# Patient Record
Sex: Female | Born: 1980 | Race: Black or African American | Hispanic: No | Marital: Single | Smoking: Never smoker
Health system: Southern US, Community
[De-identification: ages and names within clinical notes are randomized; demographics above are authoritative.]

## PROBLEM LIST (undated history)

## (undated) DIAGNOSIS — D649 Anemia, unspecified: Secondary | ICD-10-CM

## (undated) DIAGNOSIS — E785 Hyperlipidemia, unspecified: Secondary | ICD-10-CM

## (undated) HISTORY — DX: Hyperlipidemia, unspecified: E78.5

## (undated) HISTORY — PX: WISDOM TOOTH EXTRACTION: SHX21

## (undated) HISTORY — DX: Anemia, unspecified: D64.9

---

## 2007-08-31 ENCOUNTER — Emergency Department (HOSPITAL_COMMUNITY): Admission: EM | Admit: 2007-08-31 | Discharge: 2007-08-31 | Payer: Self-pay | Admitting: Family Medicine

## 2008-05-26 ENCOUNTER — Emergency Department (HOSPITAL_COMMUNITY): Admission: EM | Admit: 2008-05-26 | Discharge: 2008-05-26 | Payer: Self-pay | Admitting: Emergency Medicine

## 2009-08-09 ENCOUNTER — Ambulatory Visit: Payer: Self-pay | Admitting: Family Medicine

## 2009-08-09 DIAGNOSIS — R5381 Other malaise: Secondary | ICD-10-CM | POA: Insufficient documentation

## 2009-08-09 DIAGNOSIS — R5383 Other fatigue: Secondary | ICD-10-CM

## 2009-08-10 LAB — CONVERTED CEMR LAB
BUN: 12 mg/dL (ref 6–23)
Basophils Absolute: 0.1 10*3/uL (ref 0.0–0.1)
Cholesterol: 230 mg/dL — ABNORMAL HIGH (ref 0–200)
Eosinophils Absolute: 0.1 10*3/uL (ref 0.0–0.7)
GFR calc non Af Amer: 109.74 mL/min (ref >60)
HCT: 35 % — ABNORMAL LOW (ref 36.0–46.0)
Hemoglobin: 12.1 g/dL (ref 12.0–15.0)
Lymphocytes Relative: 33.6 % (ref 12.0–46.0)
Lymphs Abs: 2.5 10*3/uL (ref 0.7–4.0)
MCHC: 34.6 g/dL (ref 30.0–36.0)
Neutro Abs: 4.3 10*3/uL (ref 1.4–7.7)
Potassium: 3.7 meq/L (ref 3.5–5.1)
RDW: 12.6 % (ref 11.5–14.6)
Triglycerides: 57 mg/dL (ref 0.0–149.0)
VLDL: 11.4 mg/dL (ref 0.0–40.0)

## 2009-08-18 ENCOUNTER — Encounter: Payer: Self-pay | Admitting: Family Medicine

## 2009-08-18 ENCOUNTER — Ambulatory Visit: Payer: Self-pay | Admitting: Family Medicine

## 2009-08-18 ENCOUNTER — Other Ambulatory Visit: Admission: RE | Admit: 2009-08-18 | Discharge: 2009-08-18 | Payer: Self-pay | Admitting: Family Medicine

## 2009-08-18 DIAGNOSIS — E785 Hyperlipidemia, unspecified: Secondary | ICD-10-CM | POA: Insufficient documentation

## 2009-08-18 DIAGNOSIS — E7800 Pure hypercholesterolemia, unspecified: Secondary | ICD-10-CM | POA: Insufficient documentation

## 2009-08-18 LAB — CONVERTED CEMR LAB
ALT: 14 units/L (ref 0–35)
Albumin: 3.9 g/dL (ref 3.5–5.2)
Total Protein: 7 g/dL (ref 6.0–8.3)

## 2009-08-18 LAB — HM PAP SMEAR

## 2009-08-21 LAB — CONVERTED CEMR LAB

## 2009-10-02 ENCOUNTER — Ambulatory Visit: Payer: Self-pay | Admitting: Family Medicine

## 2009-10-03 LAB — CONVERTED CEMR LAB
ALT: 13 units/L (ref 0–35)
AST: 18 units/L (ref 0–37)

## 2011-01-16 ENCOUNTER — Other Ambulatory Visit: Payer: Self-pay | Admitting: *Deleted

## 2011-01-16 MED ORDER — SIMVASTATIN 10 MG PO TABS: 10.0000 mg | ORAL_TABLET | Freq: Every evening | ORAL | Status: DC

## 2011-03-14 ENCOUNTER — Other Ambulatory Visit: Payer: Self-pay | Admitting: *Deleted

## 2011-03-14 MED ORDER — SIMVASTATIN 10 MG PO TABS: 10.0000 mg | ORAL_TABLET | Freq: Every evening | ORAL | Status: DC

## 2011-07-16 LAB — CULTURE, ROUTINE-ABSCESS

## 2011-09-19 ENCOUNTER — Other Ambulatory Visit: Payer: Self-pay | Admitting: Family Medicine

## 2011-09-19 DIAGNOSIS — E785 Hyperlipidemia, unspecified: Secondary | ICD-10-CM

## 2011-09-19 DIAGNOSIS — Z0000 Encounter for general adult medical examination without abnormal findings: Secondary | ICD-10-CM

## 2011-09-23 ENCOUNTER — Other Ambulatory Visit (INDEPENDENT_AMBULATORY_CARE_PROVIDER_SITE_OTHER): Payer: 59

## 2011-09-23 DIAGNOSIS — E785 Hyperlipidemia, unspecified: Secondary | ICD-10-CM

## 2011-09-23 DIAGNOSIS — Z0000 Encounter for general adult medical examination without abnormal findings: Secondary | ICD-10-CM

## 2011-09-23 LAB — HEPATIC FUNCTION PANEL
ALT: 13 U/L (ref 0–35)
AST: 19 U/L (ref 0–37)
Albumin: 4 g/dL (ref 3.5–5.2)
Alkaline Phosphatase: 52 U/L (ref 39–117)

## 2011-09-23 LAB — BASIC METABOLIC PANEL
CO2: 24 mEq/L (ref 19–32)
Calcium: 9.1 mg/dL (ref 8.4–10.5)
Creatinine, Ser: 0.8 mg/dL (ref 0.4–1.2)
GFR: 108.14 mL/min (ref >60.00)
Glucose, Bld: 98 mg/dL (ref 70–99)

## 2011-09-23 LAB — LIPID PANEL
Cholesterol: 216 mg/dL — ABNORMAL HIGH (ref 0–200)
Total CHOL/HDL Ratio: 4

## 2011-09-24 ENCOUNTER — Other Ambulatory Visit: Payer: Self-pay

## 2011-09-27 ENCOUNTER — Encounter: Payer: Self-pay | Admitting: Family Medicine

## 2011-09-30 ENCOUNTER — Encounter: Payer: Self-pay | Admitting: Family Medicine

## 2011-09-30 ENCOUNTER — Ambulatory Visit (INDEPENDENT_AMBULATORY_CARE_PROVIDER_SITE_OTHER): Payer: 59 | Admitting: Family Medicine

## 2011-09-30 ENCOUNTER — Other Ambulatory Visit (HOSPITAL_COMMUNITY)
Admission: RE | Admit: 2011-09-30 | Discharge: 2011-09-30 | Disposition: A | Discharge status: Home / Self Care | Payer: 59 | Source: Ambulatory Visit | Attending: Family Medicine | Admitting: Family Medicine

## 2011-09-30 VITALS — BP 130/90 | HR 77 | Temp 98.7°F | Ht 62.0 in | Wt 161.5 lb

## 2011-09-30 DIAGNOSIS — Z113 Encounter for screening for infections with a predominantly sexual mode of transmission: Secondary | ICD-10-CM | POA: Insufficient documentation

## 2011-09-30 DIAGNOSIS — Z1159 Encounter for screening for other viral diseases: Secondary | ICD-10-CM | POA: Insufficient documentation

## 2011-09-30 DIAGNOSIS — Z0000 Encounter for general adult medical examination without abnormal findings: Secondary | ICD-10-CM

## 2011-09-30 DIAGNOSIS — Z01419 Encounter for gynecological examination (general) (routine) without abnormal findings: Secondary | ICD-10-CM | POA: Insufficient documentation

## 2011-09-30 MED ORDER — SIMVASTATIN 10 MG PO TABS: 10.0000 mg | ORAL_TABLET | Freq: Every evening | ORAL | Status: DC

## 2011-09-30 NOTE — Progress Notes (Signed)
  Subjective:    Patient ID: Tina Cooper, female    DOB: June 30, 1981, 30 y.o.   MRN: [id number]  HPI  30 yo here for CPX.  Doing well.  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  HLD- ran out of zocor months ago but lipid panel has improved. No myalgias.  Lab Results  Component Value Date   CHOL 216* 09/23/2011   HDL 49.70 09/23/2011   LDLCALC 127* 10/02/2009   LDLDIRECT 163.8 09/23/2011   TRIG 79.0 09/23/2011   CHOLHDL 4 09/23/2011    Review of Systems    See HPI Patient reports no  vision/ hearing changes,anorexia, weight change, fever ,adenopathy, persistant / recurrent hoarseness, swallowing issues, chest pain, edema,persistant / recurrent cough, hemoptysis, dyspnea(rest, exertional, paroxysmal nocturnal), gastrointestinal  bleeding (melena, rectal bleeding), abdominal pain, excessive heart burn, GU symptoms(dysuria, hematuria, pyuria, voiding/incontinence  Issues) syncope, focal weakness, severe memory loss, concerning skin lesions, depression, anxiety, abnormal bruising/bleeding, major joint swelling, breast masses or abnormal vaginal bleeding.    Objective:   Physical Exam BP 130/90  Pulse 77  Temp(Src) 98.7 F (37.1 C) (Oral)  Ht 5\' 2"  (1.575 m)  Wt 161 lb 8 oz (73.256 kg)  BMI 29.54 kg/m2  LMP 09/22/2011  General:  Well-developed,well-nourished,in no acute distress; alert,appropriate and cooperative throughout examination Head:  normocephalic and atraumatic.   Eyes:  vision grossly intact, pupils equal, pupils round, and pupils reactive to light.   Ears:  R ear normal and L ear normal.   Nose:  no external deformity.   Mouth:  good dentition.   Neck:  No deformities, masses, or tenderness noted. Breasts:  No mass, nodules, thickening, tenderness, bulging, retraction, inflamation, nipple discharge or skin changes noted.   Lungs:  Normal respiratory effort, chest expands symmetrically. Lungs are clear to auscultation, no crackles or wheezes. Heart:  Normal  rate and regular rhythm. S1 and S2 normal without gallop, murmur, click, rub or other extra sounds. Abdomen:  Bowel sounds positive,abdomen soft and non-tender without masses, organomegaly or hernias noted. Rectal:  no external abnormalities.   Genitalia:  Pelvic Exam:        External: normal female genitalia without lesions or masses        Vagina: normal without lesions or masses        Cervix: normal without lesions or masses        Adnexa: normal bimanual exam without masses or fullness        Uterus: normal by palpation        Pap smear: performed Msk:  No deformity or scoliosis noted of thoracic or lumbar spine.   Extremities:  No clubbing, cyanosis, edema, or deformity noted with normal full range of motion of all joints.   Neurologic:  alert & oriented X3 and gait normal.   Skin:  Intact without suspicious lesions or rashes Cervical Nodes:  No lymphadenopathy noted Axillary Nodes:  No palpable lymphadenopathy Psych:  Cognition and judgment appear intact. Alert and cooperative with normal attention span and concentration. No apparent delusions, illusions, hallucination      Assessment & Plan:   1. Routine general medical examination at a health care facility  Cytology -Pap Smear   Reviewed preventive care protocols, scheduled due services, and updated immunizations Discussed nutrition, exercise, diet, and healthy lifestyle.

## 2011-09-30 NOTE — Patient Instructions (Signed)
Have a wonderful Christmas, [place]. We will send you a letter or call you regarding your pap smear results. Please restart the Zocor.

## 2011-10-04 ENCOUNTER — Encounter: Payer: Self-pay | Admitting: *Deleted

## 2012-06-26 ENCOUNTER — Telehealth: Payer: Self-pay

## 2012-06-26 NOTE — Telephone Encounter (Signed)
Pt left v/m that she had a question. No further message. Left v/m for pt to call back.

## 2012-07-03 NOTE — Telephone Encounter (Signed)
left vm for pt to call back.

## 2012-07-09 NOTE — Telephone Encounter (Signed)
Letter sent to pt

## 2012-08-31 ENCOUNTER — Ambulatory Visit (INDEPENDENT_AMBULATORY_CARE_PROVIDER_SITE_OTHER): Payer: 59 | Admitting: Family Medicine

## 2012-08-31 ENCOUNTER — Encounter: Payer: Self-pay | Admitting: Family Medicine

## 2012-08-31 VITALS — BP 150/88 | HR 80 | Temp 98.5°F | Wt 160.0 lb

## 2012-08-31 DIAGNOSIS — J329 Chronic sinusitis, unspecified: Secondary | ICD-10-CM

## 2012-08-31 MED ORDER — AMOXICILLIN 875 MG PO TABS: 875.0000 mg | ORAL_TABLET | Freq: Two times a day (BID) | ORAL | Status: DC

## 2012-08-31 MED ORDER — FLUTICASONE PROPIONATE 50 MCG/ACT NA SUSP: 2.0000 | Freq: Every day | NASAL | Status: DC

## 2012-08-31 NOTE — Patient Instructions (Addendum)
Take antibiotic as directed- 1 tablet twice daily x 10 days.  Flonase. Drink lots of fluids.  Treat sympotmatically with Mucinex, nasal saline irrigation, and Tylenol/Ibuprofen.  Call if not improving as expected in 5-7 days.

## 2012-08-31 NOTE — Progress Notes (Signed)
SUBJECTIVE:  Tina Cooper is a 31 y.o. female who complains of coryza, congestion, sore throat, dry cough and bilateral sinus pain for 8 days. She denies a history of anorexia, chest pain, fatigue, fevers, myalgias and nausea and denies a history of asthma. Patient denies smoke cigarettes.   Patient Active Problem List  Diagnosis  . HYPERLIPIDEMIA  . FATIGUE  . Routine general medical examination at a health care facility   No past medical history on file. No past surgical history on file. History  Substance Use Topics  . Smoking status: Never Smoker   . Smokeless tobacco: Not on file  . Alcohol Use: No   Family History  Problem Relation Age of Onset  . Diabetes Mother   . Hypertension Mother   . Hyperlipidemia Father   . Hypertension Father    No Known Allergies Current Outpatient Prescriptions on File Prior to Visit  Medication Sig Dispense Refill  . simvastatin (ZOCOR) 10 MG tablet Take 1 tablet (10 mg total) by mouth every evening.  30 tablet  11   The PMH, PSH, Social History, Family History, Medications, and allergies have been reviewed in [facility], and have been updated if relevant.  OBJECTIVE: BP 150/88  Pulse 80  Temp 98.5 F (36.9 C)  Wt 160 lb (72.576 kg)  She appears well, vital signs are as noted. Ears normal.  Throat and pharynx normal.  Neck supple. No adenopathy in the neck. Nose is congested. Sinuses non tender. The chest is clear, without wheezes or rales.  ASSESSMENT:  sinusitis  PLAN: Given duration and progression of symptoms, will treat for bacterial sinusitis with Augmentin, start Flonase. Symptomatic therapy suggested: push fluids, rest and return office visit prn if symptoms persist or worsen.  Call or return to clinic prn if these symptoms worsen or fail to improve as anticipated.

## 2012-09-24 ENCOUNTER — Other Ambulatory Visit: Payer: Self-pay | Admitting: Family Medicine

## 2012-11-21 ENCOUNTER — Other Ambulatory Visit: Payer: Self-pay

## 2012-12-02 ENCOUNTER — Other Ambulatory Visit: Payer: Self-pay | Admitting: Family Medicine

## 2013-01-14 ENCOUNTER — Encounter: Payer: Self-pay | Admitting: Family Medicine

## 2013-01-14 ENCOUNTER — Ambulatory Visit (INDEPENDENT_AMBULATORY_CARE_PROVIDER_SITE_OTHER): Payer: 59 | Admitting: Family Medicine

## 2013-01-14 VITALS — BP 120/82 | HR 84 | Temp 98.5°F | Wt 159.0 lb

## 2013-01-14 DIAGNOSIS — Z113 Encounter for screening for infections with a predominantly sexual mode of transmission: Secondary | ICD-10-CM

## 2013-01-14 DIAGNOSIS — E785 Hyperlipidemia, unspecified: Secondary | ICD-10-CM

## 2013-01-14 LAB — COMPREHENSIVE METABOLIC PANEL
ALT: 11 U/L (ref 0–35)
AST: 15 U/L (ref 0–37)
CO2: 25 mEq/L (ref 19–32)
Chloride: 107 mEq/L (ref 96–112)
GFR: 112.04 mL/min (ref >60.00)
Sodium: 138 mEq/L (ref 135–145)
Total Bilirubin: 0.2 mg/dL — ABNORMAL LOW (ref 0.3–1.2)
Total Protein: 7.3 g/dL (ref 6.0–8.3)

## 2013-01-14 LAB — LIPID PANEL: VLDL: 17.8 mg/dL (ref 0.0–40.0)

## 2013-01-14 MED ORDER — SIMVASTATIN 10 MG PO TABS: ORAL_TABLET | ORAL | Status: DC

## 2013-01-14 NOTE — Patient Instructions (Signed)
Good to see you. We will call you with your lab results.   

## 2013-01-14 NOTE — Progress Notes (Signed)
32 yo here for Rx refills.  Has not been seen for routine care since 2012.  Doing well. 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  HLD- on zocor 10 mg daily.  Due for labs. No myalgias.   Lab Results  Component Value Date   CHOL 216* 09/23/2011   HDL 49.70 09/23/2011   LDLCALC 127* 10/02/2009   LDLDIRECT 163.8 09/23/2011   TRIG 79.0 09/23/2011   CHOLHDL 4 09/23/2011   She would also like STD screening.  Denies any vaginal discharge or dysuria.  Patient Active Problem List  Diagnosis  . HYPERLIPIDEMIA  . FATIGUE   No past medical history on file. No past surgical history on file. History  Substance Use Topics  . Smoking status: Never Smoker   . Smokeless tobacco: Not on file  . Alcohol Use: No   Family History  Problem Relation Age of Onset  . Diabetes Mother   . Hypertension Mother   . Hyperlipidemia Father   . Hypertension Father    No Known Allergies Current Outpatient Prescriptions on File Prior to Visit  Medication Sig Dispense Refill  . fluticasone (FLONASE) 50 MCG/ACT nasal spray Place 2 sprays into the nose daily.  16 g  6   No current facility-administered medications on file prior to visit.   The PMH, PSH, Social History, Family History, Medications, and allergies have been reviewed in [facility], and have been updated if relevant.  ROS: See HPI  Physical exam: BP 120/82  Pulse 84  Temp(Src) 98.5 F (36.9 C)  Wt 159 lb (72.122 kg)  BMI 29.07 kg/m2  General:  Well-developed,well-nourished,in no acute distress; alert,appropriate and cooperative throughout examination Head:  normocephalic and atraumatic.   Eyes:  vision grossly intact, pupils equal, pupils round, and pupils reactive to light.   Ears:  R ear normal and L ear normal.   Nose:  no external deformity.   Mouth:  good dentition.   Lungs:  Normal respiratory effort, chest expands symmetrically. Lungs are clear to auscultation, no crackles or wheezes. Heart:  Normal rate and regular  rhythm. S1 and S2 normal without gallop, murmur, click, rub or other extra sounds. Msk:  No deformity or scoliosis noted of thoracic or lumbar spine.   Extremities:  No clubbing, cyanosis, edema, or deformity noted with normal full range of motion of all joints.   Neurologic:  alert & oriented X3 and gait normal.   Skin:  Intact without suspicious lesions or rashes Psych:  Cognition and judgment appear intact. Alert and cooperative with normal attention span and concentration. No apparent delusions, illusions, hallucinations  Assessment and Plan: 1. HYPERLIPIDEMIA Continue Zocor 10 mg daily.  Rx refilled.  Check labs today. - Comprehensive metabolic panel - Lipid Panel  2. Screening for STDs (sexually transmitted diseases) Declined pelvic exam and vaginal swabs. - HIV Antibody - RPR - HSV(herpes simplex vrs) 1+2 ab-IgM

## 2013-01-15 LAB — RPR

## 2013-01-15 LAB — HIV ANTIBODY (ROUTINE TESTING W REFLEX): HIV: NONREACTIVE

## 2013-01-15 LAB — HSV(HERPES SIMPLEX VRS) I + II AB-IGM: Herpes Simplex Vrs I&II-IgM Ab (EIA): 0.91 INDEX

## 2013-01-15 MED ORDER — SIMVASTATIN 20 MG PO TABS: 20.0000 mg | ORAL_TABLET | Freq: Every evening | ORAL | Status: DC

## 2013-01-15 NOTE — Addendum Note (Signed)
Addended by: Eliezer Bottom on: 01/15/2013 10:17 AM   Modules accepted: Orders

## 2013-06-15 ENCOUNTER — Other Ambulatory Visit: Payer: Self-pay

## 2013-06-15 ENCOUNTER — Other Ambulatory Visit (INDEPENDENT_AMBULATORY_CARE_PROVIDER_SITE_OTHER): Payer: 59

## 2013-06-15 ENCOUNTER — Other Ambulatory Visit: Payer: Self-pay | Admitting: Family Medicine

## 2013-06-15 DIAGNOSIS — R5381 Other malaise: Secondary | ICD-10-CM

## 2013-06-15 DIAGNOSIS — E785 Hyperlipidemia, unspecified: Secondary | ICD-10-CM

## 2013-06-15 LAB — COMPREHENSIVE METABOLIC PANEL
ALT: 16 U/L (ref 0–35)
BUN: 18 mg/dL (ref 6–23)
CO2: 26 mEq/L (ref 19–32)
Calcium: 9.5 mg/dL (ref 8.4–10.5)
Chloride: 105 mEq/L (ref 96–112)
Creatinine, Ser: 0.8 mg/dL (ref 0.4–1.2)
GFR: 101.07 mL/min (ref >60.00)

## 2013-06-15 LAB — LIPID PANEL
Cholesterol: 183 mg/dL (ref 0–200)
Triglycerides: 151 mg/dL — ABNORMAL HIGH (ref 0.0–149.0)

## 2013-06-15 MED ORDER — SIMVASTATIN 20 MG PO TABS: 20.0000 mg | ORAL_TABLET | Freq: Every evening | ORAL | Status: DC

## 2013-06-15 NOTE — Progress Notes (Signed)
Terri in lab said not to order labs she would take care of ordering.

## 2013-06-15 NOTE — Telephone Encounter (Signed)
Pt request refill Simvastatin to [facility] out pt pharmacy. Advised done. Pt to have fasting lab lipid and CMET this AM.

## 2013-06-16 ENCOUNTER — Encounter: Payer: Self-pay | Admitting: *Deleted

## 2013-07-22 ENCOUNTER — Ambulatory Visit (INDEPENDENT_AMBULATORY_CARE_PROVIDER_SITE_OTHER): Payer: 59 | Admitting: Family Medicine

## 2013-07-22 ENCOUNTER — Encounter: Payer: Self-pay | Admitting: Family Medicine

## 2013-07-22 VITALS — BP 122/70 | HR 80 | Temp 98.9°F | Wt 161.0 lb

## 2013-07-22 DIAGNOSIS — R21 Rash and other nonspecific skin eruption: Secondary | ICD-10-CM

## 2013-07-22 DIAGNOSIS — N898 Other specified noninflammatory disorders of vagina: Secondary | ICD-10-CM

## 2013-07-22 DIAGNOSIS — Z113 Encounter for screening for infections with a predominantly sexual mode of transmission: Secondary | ICD-10-CM

## 2013-07-22 NOTE — Progress Notes (Signed)
  Subjective:    Patient ID: Tina Cooper, female    DOB: 12-12-1980, 32 y.o.   MRN: [id number]  HPI CC: vag discharge  1 d h/o vaginal discharge.  Milky white.   No fevers/chills, abd pain, nausea, itching, swollen glands. + back pain.  Small sores in pubic region she would like evaluated as well.  Not tender.  No drainage  LMP - 07/04/2013  Currently sexually active - 2 partners in last year.  Would like testing for STDs. No h/o STDs.  Always normal pap smears.  Last 2012.  History reviewed. No pertinent past medical history.   Review of Systems Per HPI    Objective:   Physical Exam  Nursing note and vitals reviewed. Constitutional: She appears well-developed and well-nourished. No distress.  Abdominal: Soft. Normal appearance and bowel sounds are normal. She exhibits no distension and no mass. There is no hepatosplenomegaly. There is no tenderness. There is no rigidity, no rebound, no guarding, no CVA tenderness and negative Murphy's sign.  Genitourinary: Vagina normal. Pelvic exam was performed with patient supine. There is no rash, tenderness, lesion or injury on the right labia. There is no rash, tenderness, lesion or injury on the left labia. Cervix exhibits no motion tenderness, no discharge and no friability. No tenderness around the vagina. No signs of injury around the vagina. No vaginal discharge found.  Mild white physiological discharge  Lymphadenopathy:       Right: No inguinal adenopathy present.       Left: No inguinal adenopathy present.  Skin: Rash noted.  Few bumps on inguinal region with mild induration, no erythema, nontender, no fluctuance       Assessment & Plan:

## 2013-07-22 NOTE — Patient Instructions (Addendum)
Wet prep checked today.  STD check today. We will call you with results. I think you have small case of folliculitis - treat with warm compresses to speed resolution.  If persistent let us know. Call us with questions.

## 2013-07-22 NOTE — Assessment & Plan Note (Addendum)
Overall normal exam. Check wet prep today. STD check per pt preference today as well. Will call with results.

## 2013-07-22 NOTE — Addendum Note (Signed)
Addended by: Josph Macho A on: 07/22/2013 07:25 PM   Modules accepted: Orders

## 2013-07-22 NOTE — Assessment & Plan Note (Signed)
Anticipate mild case of folliculitis, but in healing stage.  rec warm compresses.  Update if persistent, consider short oral abx course.

## 2013-07-23 LAB — GC/CHLAMYDIA PROBE AMP: CT Probe RNA: NEGATIVE

## 2013-07-23 LAB — RPR

## 2013-07-24 ENCOUNTER — Other Ambulatory Visit: Payer: Self-pay | Admitting: Family Medicine

## 2013-07-24 LAB — WET PREP BY MOLECULAR PROBE
Candida species: NEGATIVE
Gardnerella vaginalis: POSITIVE — AB
Trichomonas vaginosis: NEGATIVE

## 2013-07-24 MED ORDER — METRONIDAZOLE 500 MG PO TABS: 500.0000 mg | ORAL_TABLET | Freq: Two times a day (BID) | ORAL | Status: DC

## 2013-08-02 ENCOUNTER — Encounter: Payer: Self-pay | Admitting: Family Medicine

## 2013-08-02 ENCOUNTER — Ambulatory Visit (INDEPENDENT_AMBULATORY_CARE_PROVIDER_SITE_OTHER): Payer: 59 | Admitting: Family Medicine

## 2013-08-02 VITALS — BP 150/90 | HR 80 | Temp 99.1°F | Wt 164.5 lb

## 2013-08-02 DIAGNOSIS — J019 Acute sinusitis, unspecified: Secondary | ICD-10-CM

## 2013-08-02 MED ORDER — FLUTICASONE PROPIONATE 50 MCG/ACT NA SUSP: 2.0000 | Freq: Every day | NASAL | Status: DC

## 2013-08-02 MED ORDER — AMOXICILLIN-POT CLAVULANATE 875-125 MG PO TABS: 1.0000 | ORAL_TABLET | Freq: Two times a day (BID) | ORAL | Status: AC

## 2013-08-02 NOTE — Assessment & Plan Note (Signed)
Anticipate viral given short duration. Treat with WASP for augmentin.  Reasons to treat discussed. Pt agrees with plan. See pt instructions for plan.

## 2013-08-02 NOTE — Progress Notes (Signed)
Patient seen and examined with PA student Ricarda Frame.  Note reviewed, agree with assessment and plan unless changes documented in my note.   CC: sinus infection?   5d h/o facial pressure over maxillary sinuses, cough productive of thin clear mucous, yellow rhinorrhea, and facial stuffiness.  Tried tylenol, flonase, mucinex, with only mid relief.  No fevers/chills, chest tightness or dyspnea, HA, abd pain, tooth pain, sneezing, wheezing, ear pain or itchy eyes.  No PNdrainage  Mild allergic rhinitis. No smokers at home. Works at hospital.  PE: WDWN, AAF, NAD, tired appearing Tender over maxillary sinuses, none over frontal.  Nasal mucosal edema and pallor. Oropharynx clear No cervical LAD CTAB, no crackles/wheezing Nl S1, S2, no m/r/g

## 2013-08-02 NOTE — Patient Instructions (Signed)
You have a sinus infection. I do think this is viral so give a few more days - most sinus infections resolve within 7-10 days.  If persistent symptoms or worsening symptoms, fill antibiotic (augmentin). Push fluids and plenty of rest. Nasal saline irrigation or neti pot to help drain sinuses. May use simple mucinex with plenty of fluid to help mobilize mucous. Let us know if fever >101.5, trouble opening/closing mouth, difficulty swallowing, or worsening - you may need to be seen again.

## 2013-08-02 NOTE — Progress Notes (Signed)
  Subjective:    Patient ID: Tina Cooper, female    DOB: 1980-10-16, 32 y.o.   MRN: [id number]  HPI CC: facial pressure, productive cough, congestion, rhinorrhea, & sore throat.  Facial pressure over maxillary sinuses.Cough productive of clear, thin mucus. Yellow rhinorrhea.  Symptoms began 5 days ago with little improvement. Tylenol cold and sinus, ibuprofen, Mucinex, and Flonase have provided mild relief but symptoms persist. Nothing makes symptoms worse. Similar symptoms last October treated with antibiotic. Hx of mild seasonal allergies but feels this is worse.  Denies SOB, post nasal drip, headaches, fevers, chills, nausea, vomiting, diarrhea, constipation, palpitations, chest pain, tooth pain, sneezing, wheezing, ear pain, difficulty swallowing, itchy eyes. .   Review of Systems Per HPI    Objective:   Physical Exam  Constitutional: She appears well-developed and well-nourished. No distress.  HENT:  Head: Normocephalic and atraumatic.  Right Ear: Tympanic membrane and external ear normal.  Left Ear: Tympanic membrane and external ear normal.  Nose: Mucosal edema present. Right sinus exhibits maxillary sinus tenderness. Right sinus exhibits no frontal sinus tenderness. Left sinus exhibits maxillary sinus tenderness. Left sinus exhibits no frontal sinus tenderness.  Mouth/Throat: Oropharynx is clear and moist. No oropharyngeal exudate or posterior oropharyngeal erythema.  Pale mucosa   Eyes: Conjunctivae and EOM are normal. Pupils are equal, round, and reactive to light. Right eye exhibits no discharge. Left eye exhibits no discharge. No scleral icterus.  Neck: Normal range of motion. Neck supple.  Cardiovascular: Normal rate, regular rhythm, normal heart sounds and intact distal pulses.   Pulmonary/Chest: Effort normal and breath sounds normal. No respiratory distress. She has no wheezes. She has no rales.  Abdominal: Soft. Bowel sounds are normal.  Lymphadenopathy:    She has no  cervical adenopathy.  Skin: Skin is warm and dry. No rash noted. She is not diaphoretic. No erythema.       Assessment & Plan:  Sinusitus  Explained that most sinus infections are viral and should resolve within 7-10 days Continue Mucinex with plenty of water, Ibuprofen, and Flonase as needed.  Handed prescription for Augmentin 875/125 mg BID for 10 days and instructed to fill only if symptoms persist beyond 7-10 days. Contact us if symptoms worsen or fail to improve.

## 2013-08-12 ENCOUNTER — Other Ambulatory Visit: Payer: Self-pay

## 2013-08-24 ENCOUNTER — Encounter: Payer: 59 | Admitting: Internal Medicine

## 2013-10-06 ENCOUNTER — Other Ambulatory Visit: Payer: Self-pay | Admitting: Family Medicine

## 2014-01-24 ENCOUNTER — Other Ambulatory Visit: Payer: Self-pay | Admitting: Family Medicine

## 2014-02-24 ENCOUNTER — Other Ambulatory Visit: Payer: Self-pay | Admitting: Family Medicine

## 2014-04-05 ENCOUNTER — Other Ambulatory Visit: Payer: Self-pay | Admitting: Family Medicine

## 2014-04-29 ENCOUNTER — Ambulatory Visit (INDEPENDENT_AMBULATORY_CARE_PROVIDER_SITE_OTHER): Payer: 59 | Admitting: Family Medicine

## 2014-04-29 ENCOUNTER — Encounter: Payer: Self-pay | Admitting: Family Medicine

## 2014-04-29 VITALS — BP 142/92 | HR 81 | Temp 98.2°F | Ht 62.0 in | Wt 159.0 lb

## 2014-04-29 DIAGNOSIS — E785 Hyperlipidemia, unspecified: Secondary | ICD-10-CM

## 2014-04-29 DIAGNOSIS — R0602 Shortness of breath: Secondary | ICD-10-CM

## 2014-04-29 LAB — BRAIN NATRIURETIC PEPTIDE: Pro B Natriuretic peptide (BNP): 30 pg/mL (ref 0.0–100.0)

## 2014-04-29 LAB — COMPREHENSIVE METABOLIC PANEL
ALK PHOS: 50 U/L (ref 39–117)
ALT: 11 U/L (ref 0–35)
AST: 22 U/L (ref 0–37)
Albumin: 4 g/dL (ref 3.5–5.2)
BUN: 14 mg/dL (ref 6–23)
CALCIUM: 9.1 mg/dL (ref 8.4–10.5)
CO2: 28 mEq/L (ref 19–32)
CREATININE: 0.7 mg/dL (ref 0.4–1.2)
Chloride: 107 mEq/L (ref 96–112)
GFR: 116.35 mL/min (ref >60.00)
GLUCOSE: 92 mg/dL (ref 70–99)
Potassium: 3.8 mEq/L (ref 3.5–5.1)
Sodium: 138 mEq/L (ref 135–145)
Total Bilirubin: 0.4 mg/dL (ref 0.2–1.2)
Total Protein: 7.4 g/dL (ref 6.0–8.3)

## 2014-04-29 LAB — LIPID PANEL
CHOL/HDL RATIO: 5
Cholesterol: 240 mg/dL — ABNORMAL HIGH (ref 0–200)
HDL: 45.1 mg/dL (ref >39.00)
LDL Cholesterol: 177 mg/dL — ABNORMAL HIGH (ref 0–99)
NonHDL: 194.9
Triglycerides: 89 mg/dL (ref 0.0–149.0)
VLDL: 17.8 mg/dL (ref 0.0–40.0)

## 2014-04-29 MED ORDER — SIMVASTATIN 20 MG PO TABS: ORAL_TABLET | ORAL | Status: DC

## 2014-04-29 NOTE — Assessment & Plan Note (Signed)
Likely deconditioning. Exam reassuring. Advised stress test/EKG but she prefers blood work first. Unlikely CHF or PE but will check labs as requested.

## 2014-04-29 NOTE — Progress Notes (Signed)
Pre visit review using our clinic review tool, if applicable. No additional management support is needed unless otherwise documented below in the visit note. 

## 2014-04-29 NOTE — Progress Notes (Signed)
33 yo here for Rx refills.  Has not been seen by me since 01/2013.  Doing well. 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  HLD- on zocor 10 mg daily.  Due for labs. No myalgias.   Lab Results  Component Value Date   CHOL 183 06/15/2013   HDL 46.20 06/15/2013   LDLCALC 107* 06/15/2013   LDLDIRECT 183.8 01/14/2013   TRIG 151.0* 06/15/2013   CHOLHDL 4 06/15/2013   Wants me to check D dimer and BNP today.  For months, she has noticed more swelling in her legs.  None now because she just woke up. No CP Some DOE at times.  Patient Active Problem List   Diagnosis Date Noted  . Sinusitis, acute 08/02/2013  . Vaginal discharge 07/22/2013  . Skin rash 07/22/2013  . HYPERLIPIDEMIA 08/18/2009  . FATIGUE 08/09/2009   No past medical history on file. No past surgical history on file. History  Substance Use Topics  . Smoking status: Never Smoker   . Smokeless tobacco: Not on file  . Alcohol Use: No   Family History  Problem Relation Age of Onset  . Diabetes Mother   . Hypertension Mother   . Hyperlipidemia Father   . Hypertension Father    No Known Allergies Current Outpatient Prescriptions on File Prior to Visit  Medication Sig Dispense Refill  . fluticasone (FLONASE) 50 MCG/ACT nasal spray Place 2 sprays into the nose daily.  16 g  6   No current facility-administered medications on file prior to visit.   The PMH, PSH, Social History, Family History, Medications, and allergies have been reviewed in [facility], and have been updated if relevant.  ROS: See HPI  Physical exam: BP 142/92  Pulse 81  Temp(Src) 98.2 F (36.8 C) (Oral)  Ht 5\' 2"  (1.575 m)  Wt 159 lb (72.122 kg)  BMI 29.07 kg/m2  SpO2 98%  LMP 04/22/2014  General:  Well-developed,well-nourished,in no acute distress; alert,appropriate and cooperative throughout examination Head:  normocephalic and atraumatic.   Eyes:  vision grossly intact, pupils equal, pupils round, and pupils reactive to light.   Ears:  R  ear normal and L ear normal.   Nose:  no external deformity.   Mouth:  good dentition.   Lungs:  Normal respiratory effort, chest expands symmetrically. Lungs are clear to auscultation, no crackles or wheezes. Heart:  Normal rate and regular rhythm. S1 and S2 normal without gallop, murmur, click, rub or other extra sounds. Msk:  No deformity or scoliosis noted of thoracic or lumbar spine.   Extremities:  No clubbing, cyanosis, edema, or deformity noted with normal full range of motion of all joints.   Neurologic:  alert & oriented X3 and gait normal.   Skin:  Intact without suspicious lesions or rashes Psych:  Cognition and judgment appear intact. Alert and cooperative with normal attention span and concentration. No apparent delusions, illusions, hallucinations

## 2014-04-29 NOTE — Assessment & Plan Note (Signed)
eRx refilled. Check labs today. Orders Placed This Encounter  Procedures  . Lipid panel  . Comprehensive metabolic panel  . Brain natriuretic peptide  . D-dimer, quantitative

## 2014-04-29 NOTE — Patient Instructions (Signed)
Good to see you. I will call you with your lab results.   

## 2014-04-30 LAB — D-DIMER, QUANTITATIVE (NOT AT ARMC): D-Dimer, Quant: 1.6 ug/mL-FEU — ABNORMAL HIGH (ref 0.00–0.48)

## 2014-05-02 ENCOUNTER — Telehealth: Payer: Self-pay

## 2014-05-02 ENCOUNTER — Ambulatory Visit: Payer: Self-pay | Admitting: Family Medicine

## 2014-05-02 ENCOUNTER — Other Ambulatory Visit: Payer: Self-pay | Admitting: Family Medicine

## 2014-05-02 ENCOUNTER — Encounter: Payer: Self-pay | Admitting: Family Medicine

## 2014-05-02 DIAGNOSIS — R7989 Other specified abnormal findings of blood chemistry: Secondary | ICD-10-CM

## 2014-05-02 NOTE — Telephone Encounter (Signed)
It was negative for PE (blood clot in the lung).

## 2014-05-02 NOTE — Telephone Encounter (Signed)
Pt left v/m requesting recent CT results.Please advise.

## 2014-05-02 NOTE — Telephone Encounter (Signed)
We could certainly get a doppler of her legs.  Is either leg more painful or swollen or red?

## 2014-05-02 NOTE — Telephone Encounter (Signed)
Lm on pts vm requesting a call back 

## 2014-05-02 NOTE — Telephone Encounter (Signed)
Patient notified as instructed by telephone. Pt wants to know if there will be further testing done such as test for DVT. Pt request cb.

## 2014-05-03 NOTE — Telephone Encounter (Signed)
Order entered

## 2014-05-03 NOTE — Telephone Encounter (Signed)
Spoke to pt who states that she is having swelling, but no pain or redness. Pt states that she is wanting to proceed with doppler

## 2014-05-11 ENCOUNTER — Other Ambulatory Visit (HOSPITAL_COMMUNITY): Payer: Self-pay | Admitting: *Deleted

## 2014-05-11 DIAGNOSIS — R609 Edema, unspecified: Secondary | ICD-10-CM

## 2014-05-13 ENCOUNTER — Encounter (HOSPITAL_COMMUNITY): Payer: 59

## 2014-05-19 ENCOUNTER — Ambulatory Visit (HOSPITAL_COMMUNITY): Payer: 59 | Attending: Internal Medicine | Admitting: Cardiology

## 2014-05-19 DIAGNOSIS — R0602 Shortness of breath: Secondary | ICD-10-CM | POA: Insufficient documentation

## 2014-05-19 DIAGNOSIS — E785 Hyperlipidemia, unspecified: Secondary | ICD-10-CM | POA: Diagnosis not present

## 2014-05-19 DIAGNOSIS — R609 Edema, unspecified: Secondary | ICD-10-CM | POA: Insufficient documentation

## 2014-05-19 NOTE — Telephone Encounter (Signed)
Spoke to pt and informed her of results.  

## 2014-05-19 NOTE — Progress Notes (Signed)
Bilateral lower venous duplex performed  

## 2014-05-19 NOTE — Telephone Encounter (Signed)
Message copied by Desmond DikeKNIGHT, Maliyah Willets H on Thu May 19, 2014  2:07 PM ------      Message from: Dianne DunARON, TALIA M      Created: Thu May 19, 2014 12:45 PM      Regarding: FW: prelim       Please let pt know that her dopplers were neg for blood clot      ----- Message -----         From: Richardean CanalGina M Germino         Sent: 05/19/2014  12:41 PM           To: Dianne Dunalia M Aron, MD      Subject: prelim                                                   Bilateral lower venous duplex appears negative for acute thrombus.       Thank you        ------

## 2014-07-07 ENCOUNTER — Encounter: Payer: Self-pay | Admitting: Internal Medicine

## 2014-07-07 ENCOUNTER — Other Ambulatory Visit (HOSPITAL_COMMUNITY)
Admission: RE | Admit: 2014-07-07 | Discharge: 2014-07-07 | Disposition: A | Discharge status: Home / Self Care | Payer: 59 | Source: Ambulatory Visit | Attending: Internal Medicine | Admitting: Internal Medicine

## 2014-07-07 ENCOUNTER — Ambulatory Visit (INDEPENDENT_AMBULATORY_CARE_PROVIDER_SITE_OTHER): Payer: 59 | Admitting: Internal Medicine

## 2014-07-07 VITALS — BP 128/74 | HR 93 | Temp 98.4°F | Wt 160.0 lb

## 2014-07-07 DIAGNOSIS — Z01419 Encounter for gynecological examination (general) (routine) without abnormal findings: Secondary | ICD-10-CM | POA: Insufficient documentation

## 2014-07-07 DIAGNOSIS — B373 Candidiasis of vulva and vagina: Secondary | ICD-10-CM

## 2014-07-07 DIAGNOSIS — N9089 Other specified noninflammatory disorders of vulva and perineum: Secondary | ICD-10-CM

## 2014-07-07 DIAGNOSIS — B3731 Acute candidiasis of vulva and vagina: Secondary | ICD-10-CM

## 2014-07-07 DIAGNOSIS — Z124 Encounter for screening for malignant neoplasm of cervix: Secondary | ICD-10-CM

## 2014-07-07 MED ORDER — FLUCONAZOLE 150 MG PO TABS: 150.0000 mg | ORAL_TABLET | Freq: Once | ORAL | Status: DC

## 2014-07-07 NOTE — Progress Notes (Signed)
Pre visit review using our clinic review tool, if applicable. No additional management support is needed unless otherwise documented below in the visit note. 

## 2014-07-07 NOTE — Addendum Note (Signed)
Addended by: Roena MaladyEVONTENNO, Layce Sprung Y on: 07/07/2014 01:50 PM   Modules accepted: Orders

## 2014-07-07 NOTE — Patient Instructions (Signed)

## 2014-07-07 NOTE — Progress Notes (Signed)
Subjective:    Patient ID: Tina Cooper, female    DOB: September 24, 1981, 33 y.o.   MRN: [id number]  HPI  Pt presents to the clinic today with c/o vaginal irritation. She has noticed some redness, swelling and blisters. This started 4 days ago. She reports that she is in a lot of pain. She has noticed some itching. She has tried antifungal cream which seemed to make it worse. She also tried some OTC pain relief cream that did give her some relief. She is sexually active. She does not use anything for protection or birth control. Her LMP was 06/20/14.  Review of Systems      No past medical history on file.  Current Outpatient Prescriptions  Medication Sig Dispense Refill  . fluticasone (FLONASE) 50 MCG/ACT nasal spray Place 2 sprays into the nose daily.  16 g  6  . simvastatin (ZOCOR) 20 MG tablet TAKE 1 TABLET BY MOUTH EVERY EVENING  90 tablet  0   No current facility-administered medications for this visit.    No Known Allergies  Family History  Problem Relation Age of Onset  . Diabetes Mother   . Hypertension Mother   . Hyperlipidemia Father   . Hypertension Father     History   Social History  . Marital Status: Single    Spouse Name: N/A    Number of Children: N/A  . Years of Education: N/A   Occupational History  . RN [employer]   Social History Main Topics  . Smoking status: Never Smoker   . Smokeless tobacco: Not on file  . Alcohol Use: No  . Drug Use: No  . Sexual Activity: Not on file   Other Topics Concern  . Not on file   Social History Narrative  . No narrative on file     Constitutional: Denies fever, malaise, fatigue, headache or abrupt weight changes.  GU: Pt reports vaginal discharge, blisters and irritation. Denies urgency, frequency, pain with urination, burning sensation, blood in urine, odor .   No other specific complaints in a complete review of systems (except as listed in HPI above).  Objective:   Physical Exam  BP 128/74  Pulse  93  Temp(Src) 98.4 F (36.9 C) (Oral)  Wt 160 lb (72.576 kg)  SpO2 98%  LMP 06/19/2014 Wt Readings from Last 3 Encounters:  07/07/14 160 lb (72.576 kg)  04/29/14 159 lb (72.122 kg)  08/02/13 164 lb 8 oz (74.617 kg)    General: Appears her stated age, well developed, well nourished in NAD. Cardiovascular: Normal rate and rhythm. S1,S2 noted.  No murmur, rubs or gallops noted.  Pulmonary/Chest: Normal effort and positive vesicular breath sounds. No respiratory distress. No wheezes, rales or ronchi noted.  Abdomen: Soft and nontender. Normal bowel sounds, no bruits noted. No distention or masses noted. Liver, spleen and kidneys non palpable. GU: Normal female anatomy. Small fluid filled blisters noted on bilateral labial edge, R>L, painful to touch. Thick, chunky white discharge noted at urethral opening. Was not able to visulize cervix. No CMT. [place] non palpable.  BMET    Component Value Date/Time   NA 138 04/29/2014 1157   K 3.8 04/29/2014 1157   CL 107 04/29/2014 1157   CO2 28 04/29/2014 1157   GLUCOSE 92 04/29/2014 1157   BUN 14 04/29/2014 1157   CREATININE 0.7 04/29/2014 1157   CALCIUM 9.1 04/29/2014 1157   GFRNONAA 109.74 08/09/2009 1109    Lipid Panel     Component  Value Date/Time   CHOL 240* 04/29/2014 1157   TRIG 89.0 04/29/2014 1157   HDL 45.10 04/29/2014 1157   CHOLHDL 5 04/29/2014 1157   VLDL 17.8 04/29/2014 1157   LDLCALC 177* 04/29/2014 1157    CBC    Component Value Date/Time   WBC 7.5 08/09/2009 1109   RBC 4.13 08/09/2009 1109   HGB 12.1 08/09/2009 1109   HCT 35.0* 08/09/2009 1109   PLT 279.0 08/09/2009 1109   MCV 84.8 08/09/2009 1109   MCHC 34.6 08/09/2009 1109   RDW 12.6 08/09/2009 1109   LYMPHSABS 2.5 08/09/2009 1109   MONOABS 0.5 08/09/2009 1109   EOSABS 0.1 08/09/2009 1109   BASOSABS 0.1 08/09/2009 1109    Hgb A1C No results found for this basename: HGBA1C         Assessment & Plan:   Vaginal lesions:  Viral culture obtained  Will check serum HSV 1 &  2 Continue pain relief cream at this time  Screening for cervical cancer:  Pap obtained- will mail you a letter with the results It would be a good idea to use protection/birth control if having sexual relation with multiple partners  Vaginal Candidias:  Wet prep: + yeast eRx for Diflucan for antibiotic induced yeast infection  RTC as needed or if symptoms persist or worsen

## 2014-07-08 LAB — CYTOLOGY - PAP

## 2014-07-11 LAB — HSV(HERPES SMPLX)ABS-I+II(IGG+IGM)-BLD
HERPES SIMPLEX VRS I-IGM AB (EIA): 0.83 {index}
HSV 2 Glycoprotein G Ab, IgG: <0.1 IV

## 2014-08-03 ENCOUNTER — Other Ambulatory Visit: Payer: Self-pay | Admitting: Family Medicine

## 2014-11-06 ENCOUNTER — Encounter: Payer: Self-pay | Admitting: Family Medicine

## 2014-11-07 ENCOUNTER — Other Ambulatory Visit: Payer: Self-pay | Admitting: Family Medicine

## 2014-11-07 DIAGNOSIS — B373 Candidiasis of vulva and vagina: Secondary | ICD-10-CM

## 2014-11-07 DIAGNOSIS — B3731 Acute candidiasis of vulva and vagina: Secondary | ICD-10-CM

## 2014-11-07 MED ORDER — FLUCONAZOLE 150 MG PO TABS: 150.0000 mg | ORAL_TABLET | Freq: Once | ORAL | Status: DC

## 2014-11-08 ENCOUNTER — Other Ambulatory Visit: Payer: Self-pay | Admitting: Family Medicine

## 2014-11-15 ENCOUNTER — Ambulatory Visit (INDEPENDENT_AMBULATORY_CARE_PROVIDER_SITE_OTHER): Payer: 59 | Admitting: Family Medicine

## 2014-11-15 ENCOUNTER — Encounter: Payer: Self-pay | Admitting: Family Medicine

## 2014-11-15 VITALS — BP 128/78 | HR 92 | Temp 98.3°F | Wt 156.8 lb

## 2014-11-15 DIAGNOSIS — B373 Candidiasis of vulva and vagina: Secondary | ICD-10-CM

## 2014-11-15 DIAGNOSIS — E785 Hyperlipidemia, unspecified: Secondary | ICD-10-CM

## 2014-11-15 DIAGNOSIS — B3731 Acute candidiasis of vulva and vagina: Secondary | ICD-10-CM

## 2014-11-15 LAB — LIPID PANEL
Cholesterol: 188 mg/dL (ref 0–200)
HDL: 46.9 mg/dL (ref >39.00)
LDL Cholesterol: 126 mg/dL — ABNORMAL HIGH (ref 0–99)
NONHDL: 141.1
Total CHOL/HDL Ratio: 4
Triglycerides: 75 mg/dL (ref 0.0–149.0)
VLDL: 15 mg/dL (ref 0.0–40.0)

## 2014-11-15 LAB — COMPREHENSIVE METABOLIC PANEL WITH GFR
ALT: 11 U/L (ref 0–35)
AST: 15 U/L (ref 0–37)
Albumin: 4.2 g/dL (ref 3.5–5.2)
Alkaline Phosphatase: 46 U/L (ref 39–117)
BUN: 13 mg/dL (ref 6–23)
CO2: 27 meq/L (ref 19–32)
Calcium: 9.5 mg/dL (ref 8.4–10.5)
Chloride: 106 meq/L (ref 96–112)
Creatinine, Ser: 0.84 mg/dL (ref 0.40–1.20)
GFR: 100.18 mL/min
Glucose, Bld: 91 mg/dL (ref 70–99)
Potassium: 4.1 meq/L (ref 3.5–5.1)
Sodium: 138 meq/L (ref 135–145)
Total Bilirubin: 0.3 mg/dL (ref 0.2–1.2)
Total Protein: 7.1 g/dL (ref 6.0–8.3)

## 2014-11-15 MED ORDER — SIMVASTATIN 20 MG PO TABS: 20.0000 mg | ORAL_TABLET | Freq: Every evening | ORAL | Status: DC

## 2014-11-15 MED ORDER — FLUTICASONE PROPIONATE 50 MCG/ACT NA SUSP: 2.0000 | Freq: Every day | NASAL | Status: DC

## 2014-11-15 NOTE — Patient Instructions (Signed)
Good to see you. We will call you with your lab results and you can see them online.

## 2014-11-15 NOTE — Progress Notes (Signed)
34 yo here for Rx refills.  Has not been seen by me since 01/2013.  Doing well. 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  Enjoys working nights.  HLD- on zocor 20 mg daily, increased to 20 mg daily in July 2015.  Due for labs. No myalgias.   Lab Results  Component Value Date   CHOL 240* 04/29/2014   HDL 45.10 04/29/2014   LDLCALC 177* 04/29/2014   LDLDIRECT 183.8 01/14/2013   TRIG 89.0 04/29/2014   CHOLHDL 5 04/29/2014    Patient Active Problem List   Diagnosis Date Noted  . HLD (hyperlipidemia) 08/18/2009   History reviewed. No pertinent past medical history. History reviewed. No pertinent past surgical history. History  Substance Use Topics  . Smoking status: Never Smoker   . Smokeless tobacco: Not on file  . Alcohol Use: No   Family History  Problem Relation Age of Onset  . Diabetes Mother   . Hypertension Mother   . Hyperlipidemia Father   . Hypertension Father    No Known Allergies No current outpatient prescriptions on file prior to visit.   No current facility-administered medications on file prior to visit.   The PMH, PSH, Social History, Family History, Medications, and allergies have been reviewed in [facility], and have been updated if relevant.  ROS: See HPI No CP No SOB No myalgias No LE edema now   Physical exam: BP 128/78 mmHg  Pulse 92  Temp(Src) 98.3 F (36.8 C) (Oral)  Wt 156 lb 12 oz (71.101 kg)  SpO2 97%  LMP 10/17/2014  General:  Well-developed,well-nourished,in no acute distress; alert,appropriate and cooperative throughout examination Head:  normocephalic and atraumatic.   Eyes:  vision grossly intact, pupils equal, pupils round, and pupils reactive to light.   Ears:  R ear normal and L ear normal.   Nose:  no external deformity.   Mouth:  good dentition.   Lungs:  Normal respiratory effort, chest expands symmetrically. Lungs are clear to auscultation, no crackles or wheezes. Heart:  Normal rate and regular rhythm. S1 and  S2 normal without gallop, murmur, click, rub or other extra sounds. Msk:  No deformity or scoliosis noted of thoracic or lumbar spine.   Extremities:  No clubbing, cyanosis, edema, or deformity noted with normal full range of motion of all joints.   Neurologic:  alert & oriented X3 and gait normal.   Skin:  Intact without suspicious lesions or rashes Psych:  Cognition and judgment appear intact. Alert and cooperative with normal attention span and concentration. No apparent delusions, illusions, hallucinations

## 2014-11-15 NOTE — Assessment & Plan Note (Signed)
Continue zocor 20 mg daily. Due for labs today. Orders Placed This Encounter  Procedures  . Lipid panel  . Comprehensive metabolic panel

## 2015-06-30 ENCOUNTER — Ambulatory Visit (INDEPENDENT_AMBULATORY_CARE_PROVIDER_SITE_OTHER): Payer: 59 | Admitting: Family Medicine

## 2015-06-30 ENCOUNTER — Encounter: Payer: Self-pay | Admitting: Family Medicine

## 2015-06-30 VITALS — BP 148/94 | HR 104 | Temp 99.0°F | Ht 62.0 in | Wt 157.2 lb

## 2015-06-30 DIAGNOSIS — M25561 Pain in right knee: Secondary | ICD-10-CM | POA: Diagnosis not present

## 2015-06-30 DIAGNOSIS — R030 Elevated blood-pressure reading, without diagnosis of hypertension: Secondary | ICD-10-CM | POA: Diagnosis not present

## 2015-06-30 NOTE — Assessment & Plan Note (Signed)
Appears most likely iliotibial band popping over right lateral knee with flexion 5 degrees. Point of pain is at iliotibial band.  Start with stretching exercises , NSAIDs and ice.  Follow up in 2 weeks if not improving.

## 2015-06-30 NOTE — Progress Notes (Signed)
Pre visit review using our clinic review tool, if applicable. No additional management support is needed unless otherwise documented below in the visit note. 

## 2015-06-30 NOTE — Progress Notes (Signed)
Subjective:    Patient ID: Tina Cooper, female    DOB: 04-10-1981, 34 y.o.   MRN: [id number]  HPI    Review of Systems  Constitutional: Negative for fatigue.  HENT: Negative for ear pain.   Eyes: Negative for pain.  Respiratory: Negative for cough.   Cardiovascular: Negative for chest pain.    34 year old female previously healthy presents with 1 week right knee pain .  No known incident.  No change in activity.  No exercise. Anterior pain, worse with straightening, walking.  She felt popping in right lateral knee and occ clicking going down steps.  No swelling, no redness, no pain at rest. No instability. 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  She has not taken any meds for it, no treatments.   No dx of HTN. She feels in past it has been elevated at doctors.  Pulse 104, usually 74-90. BP Readings from Last 3 Encounters:  06/30/15 148/94  11/15/14 128/78  07/07/14 128/74   Wt Readings from Last 3 Encounters:  06/30/15 157 lb 4 oz (71.328 kg)  11/15/14 156 lb 12 oz (71.101 kg)  07/07/14 160 lb (72.576 kg)          Objective:   Physical Exam  Constitutional: Vital signs are normal. She appears well-developed and well-nourished. She is cooperative.  Non-toxic appearance. She does not appear ill. No distress.  HENT:  Head: Normocephalic.  Right Ear: Hearing, tympanic membrane, external ear and ear canal normal. Tympanic membrane is not erythematous, not retracted and not bulging.  Left Ear: Hearing, tympanic membrane, external ear and ear canal normal. Tympanic membrane is not erythematous, not retracted and not bulging.  Nose: No mucosal edema or rhinorrhea. Right sinus exhibits no maxillary sinus tenderness and no frontal sinus tenderness. Left sinus exhibits no maxillary sinus tenderness and no frontal sinus tenderness.  Mouth/Throat: Uvula is midline, oropharynx is clear and moist and mucous membranes are normal.  Eyes: Conjunctivae, EOM and lids are  normal. Pupils are equal, round, and reactive to light. Lids are everted and swept, no foreign bodies found.  Neck: Trachea normal and normal range of motion. Neck supple. Carotid bruit is not present. No thyroid mass and no thyromegaly present.  Cardiovascular: Regular rhythm, S1 normal, S2 normal, normal heart sounds, intact distal pulses and normal pulses.  Tachycardia present.  Exam reveals no gallop and no friction rub.   No murmur heard. Pulmonary/Chest: Effort normal and breath sounds normal. No tachypnea. No respiratory distress. She has no decreased breath sounds. She has no wheezes. She has no rhonchi. She has no rales.  Abdominal: Soft. Normal appearance and bowel sounds are normal. There is no tenderness.  Musculoskeletal:       Right knee: She exhibits normal range of motion, no swelling, no effusion, no deformity, no bony tenderness, normal meniscus and no MCL laxity. Tenderness found. No medial joint line, no lateral joint line, no MCL, no LCL and no patellar tendon tenderness noted.  Visible popping of a structure over right lateral knee joint.. At area of insertion of iliotibial band.. This is also focally where pain is.  Neurological: She is alert.  Skin: Skin is warm, dry and intact. No rash noted.  Psychiatric: Her speech is normal and behavior is normal. Judgment and thought content normal. Her mood appears not anxious. Cognition and memory are normal. She does not exhibit a depressed mood.          Assessment &  Plan:

## 2015-06-30 NOTE — Patient Instructions (Addendum)
Follow BP at home/ pharmacy. Goal BP < 140/90. Call if you have more than 3 measurements at home > goal. Most likely iliotibial band syndrome causing pain.  Treat with ibuprofen 800 mg every 8 hours for pain as needed , ice and start stretching exercises.  If not improving follow up with Dr. Patsy Lager, sports med for further eval.

## 2015-06-30 NOTE — Assessment & Plan Note (Signed)
Follow at home or work. Call if remaining above goal. Follow up with PCP if remains elevated.

## 2015-10-31 ENCOUNTER — Other Ambulatory Visit: Payer: Self-pay | Admitting: Family Medicine

## 2015-10-31 ENCOUNTER — Encounter: Payer: Self-pay | Admitting: Family Medicine

## 2015-10-31 MED ORDER — FLUCONAZOLE 150 MG PO TABS: 150.0000 mg | ORAL_TABLET | Freq: Once | ORAL | Status: AC

## 2015-10-31 MED FILL — FLUCONAZOLE 150 MG TABLET: 150 | 1 days supply | Qty: 1 | Fill #0

## 2015-11-07 ENCOUNTER — Encounter: Payer: Self-pay | Admitting: Family Medicine

## 2015-11-07 ENCOUNTER — Other Ambulatory Visit (HOSPITAL_COMMUNITY)
Admission: RE | Admit: 2015-11-07 | Discharge: 2015-11-07 | Disposition: A | Discharge status: Home / Self Care | Payer: 59 | Source: Ambulatory Visit | Attending: Family Medicine | Admitting: Family Medicine

## 2015-11-07 ENCOUNTER — Ambulatory Visit (INDEPENDENT_AMBULATORY_CARE_PROVIDER_SITE_OTHER): Payer: 59 | Admitting: Family Medicine

## 2015-11-07 VITALS — BP 132/84 | HR 83 | Temp 98.1°F | Ht 61.5 in | Wt 154.0 lb

## 2015-11-07 DIAGNOSIS — Z01419 Encounter for gynecological examination (general) (routine) without abnormal findings: Secondary | ICD-10-CM | POA: Diagnosis not present

## 2015-11-07 DIAGNOSIS — Z113 Encounter for screening for infections with a predominantly sexual mode of transmission: Secondary | ICD-10-CM | POA: Insufficient documentation

## 2015-11-07 DIAGNOSIS — N760 Acute vaginitis: Secondary | ICD-10-CM | POA: Diagnosis not present

## 2015-11-07 DIAGNOSIS — R87810 Cervical high risk human papillomavirus (HPV) DNA test positive: Secondary | ICD-10-CM | POA: Insufficient documentation

## 2015-11-07 DIAGNOSIS — E785 Hyperlipidemia, unspecified: Secondary | ICD-10-CM | POA: Diagnosis not present

## 2015-11-07 DIAGNOSIS — Z1151 Encounter for screening for human papillomavirus (HPV): Secondary | ICD-10-CM | POA: Diagnosis not present

## 2015-11-07 DIAGNOSIS — N898 Other specified noninflammatory disorders of vagina: Secondary | ICD-10-CM | POA: Insufficient documentation

## 2015-11-07 LAB — CBC WITH DIFFERENTIAL/PLATELET
Basophils Absolute: 0.1 10*3/uL (ref 0.0–0.1)
Basophils Relative: 1.2 % (ref 0.0–3.0)
Eosinophils Absolute: 0.1 10*3/uL (ref 0.0–0.7)
Eosinophils Relative: 1.3 % (ref 0.0–5.0)
HCT: 27.9 % — ABNORMAL LOW (ref 36.0–46.0)
Hemoglobin: 8.4 g/dL — ABNORMAL LOW (ref 12.0–15.0)
Lymphocytes Relative: 37.6 % (ref 12.0–46.0)
Lymphs Abs: 3.2 10*3/uL (ref 0.7–4.0)
MCHC: 30 g/dL (ref 30.0–36.0)
MONO ABS: 0.5 10*3/uL (ref 0.1–1.0)
Monocytes Relative: 5.7 % (ref 3.0–12.0)
NEUTROS PCT: 54.2 % (ref 43.0–77.0)
Neutro Abs: 4.6 10*3/uL (ref 1.4–7.7)
Platelets: 434 10*3/uL — ABNORMAL HIGH (ref 150.0–400.0)
RBC: 4.58 Mil/uL (ref 3.87–5.11)
RDW: 17.9 % — ABNORMAL HIGH (ref 11.5–15.5)
WBC: 8.5 10*3/uL (ref 4.0–10.5)

## 2015-11-07 LAB — LIPID PANEL
Cholesterol: 205 mg/dL — ABNORMAL HIGH (ref 0–200)
HDL: 48.4 mg/dL (ref >39.00)
LDL Cholesterol: 134 mg/dL — ABNORMAL HIGH (ref 0–99)
NONHDL: 156.23
Total CHOL/HDL Ratio: 4
Triglycerides: 112 mg/dL (ref 0.0–149.0)
VLDL: 22.4 mg/dL (ref 0.0–40.0)

## 2015-11-07 LAB — COMPREHENSIVE METABOLIC PANEL
ALK PHOS: 43 U/L (ref 39–117)
ALT: 13 U/L (ref 0–35)
AST: 19 U/L (ref 0–37)
Albumin: 4.3 g/dL (ref 3.5–5.2)
BILIRUBIN TOTAL: 0.4 mg/dL (ref 0.2–1.2)
BUN: 11 mg/dL (ref 6–23)
CO2: 27 mEq/L (ref 19–32)
CREATININE: 0.7 mg/dL (ref 0.40–1.20)
Calcium: 9.4 mg/dL (ref 8.4–10.5)
Chloride: 105 mEq/L (ref 96–112)
GFR: 122.92 mL/min (ref >60.00)
GLUCOSE: 91 mg/dL (ref 70–99)
Potassium: 3.8 mEq/L (ref 3.5–5.1)
Sodium: 138 mEq/L (ref 135–145)
TOTAL PROTEIN: 7.5 g/dL (ref 6.0–8.3)

## 2015-11-07 LAB — TSH: TSH: 1.28 u[IU]/mL (ref 0.35–4.50)

## 2015-11-07 NOTE — Progress Notes (Signed)
Pre visit review using our clinic review tool, if applicable. No additional management support is needed unless otherwise documented below in the visit note. 

## 2015-11-07 NOTE — Progress Notes (Addendum)
35 yo here for CPX and follow up of chronic medical conditions.     HLD- on zocor 20 mg daily, increased to 20 mg daily in July 2015.  Due for labs. No myalgias.   She has been having some vaginal pain.  No discharge currently- "just got over a yeast infection." Pain is outer labia, bilaterally. No new sexual partners.  Does not use condoms or lubricants.  She is using a new brand of panty liner. Lab Results  Component Value Date   CHOL 188 11/15/2014   HDL 46.90 11/15/2014   LDLCALC 126* 11/15/2014   LDLDIRECT 183.8 01/14/2013   TRIG 75.0 11/15/2014   CHOLHDL 4 11/15/2014    Patient Active Problem List   Diagnosis Date Noted  . Well woman exam with routine gynecological exam 11/07/2015  . Pain in lateral portion of right knee 06/30/2015  . Blood pressure elevated without history of HTN 06/30/2015  . HLD (hyperlipidemia) 08/18/2009   No past medical history on file. No past surgical history on file. Social History  Substance Use Topics  . Smoking status: Never Smoker   . Smokeless tobacco: Never Used  . Alcohol Use: No   Family History  Problem Relation Age of Onset  . Diabetes Mother   . Hypertension Mother   . Hyperlipidemia Father   . Hypertension Father    No Known Allergies Current Outpatient Prescriptions on File Prior to Visit  Medication Sig Dispense Refill  . fluticasone (FLONASE) 50 MCG/ACT nasal spray Place 2 sprays into both nostrils daily. 16 g 6  . simvastatin (ZOCOR) 20 MG tablet Take 1 tablet (20 mg total) by mouth every evening. 90 tablet 3   No current facility-administered medications on file prior to visit.   The PMH, PSH, Social History, Family History, Medications, and allergies have been reviewed in [facility], and have been updated if relevant.  Review of Systems  Constitutional: Negative.   HENT: Negative.   Eyes: Negative.   Respiratory: Negative.   Cardiovascular: Negative.   Gastrointestinal: Negative.   Endocrine: Negative.    Genitourinary: Positive for vaginal pain. Negative for urgency, decreased urine volume, vaginal bleeding, vaginal discharge and pelvic pain.  Musculoskeletal: Negative.   Skin: Negative.   Allergic/Immunologic: Negative.   Neurological: Negative.   Hematological: Negative.   Psychiatric/Behavioral: Negative.   All other systems reviewed and are negative.    Physical exam: There were no vitals taken for this visit.   General:  Well-developed,well-nourished,in no acute distress; alert,appropriate and cooperative throughout examination Head:  normocephalic and atraumatic.   Eyes:  vision grossly intact, pupils equal, pupils round, and pupils reactive to light.   Ears:  R ear normal and L ear normal.   Nose:  no external deformity.   Mouth:  good dentition.   Neck:  No deformities, masses, or tenderness noted. Breasts:  No mass, nodules, thickening, tenderness, bulging, retraction, inflamation, nipple discharge or skin changes noted.   Lungs:  Normal respiratory effort, chest expands symmetrically. Lungs are clear to auscultation, no crackles or wheezes. Heart:  Normal rate and regular rhythm. S1 and S2 normal without gallop, murmur, click, rub or other extra sounds. Abdomen:  Bowel sounds positive,abdomen soft and non-tender without masses, organomegaly or hernias noted. Rectal:  no external abnormalities.   Genitalia:  Pelvic Exam:        External: normal female genitalia without lesions or masses        Vagina: outer labia erythematous, no lesions  Cervix: normal without lesions or masses        Adnexa: normal bimanual exam without masses or fullness        Uterus: normal by palpation        Pap smear: performed Msk:  No deformity or scoliosis noted of thoracic or lumbar spine.   Extremities:  No clubbing, cyanosis, edema, or deformity noted with normal full range of motion of all joints.   Neurologic:  alert & oriented X3 and gait normal.   Skin:  Intact without  suspicious lesions or rashes Cervical Nodes:  No lymphadenopathy noted Axillary Nodes:  No palpable lymphadenopathy Psych:  Cognition and judgment appear intact. Alert and cooperative with normal attention span and concentration. No apparent delusions, illusions, hallucinations

## 2015-11-07 NOTE — Addendum Note (Signed)
Addended by: Dianne Dun on: 11/07/2015 01:32 PM   Modules accepted: Kipp Brood

## 2015-11-07 NOTE — Assessment & Plan Note (Signed)
New- ? Secondary to new panty liners. Advised to stop using this brand. Hydrocortisone to area twice daily for no more than 10 days. STD screening as planned.

## 2015-11-07 NOTE — Addendum Note (Signed)
Addended by: Baldomero Lamy on: 11/07/2015 02:35 PM   Modules accepted: Kipp Brood

## 2015-11-07 NOTE — Assessment & Plan Note (Signed)
Reviewed preventive care protocols, scheduled due services, and updated immunizations Discussed nutrition, exercise, diet, and healthy lifestyle.  Pap smear today, STD screening and labs.  Orders Placed This Encounter  Procedures  . CBC with Differential/Platelet  . Comprehensive metabolic panel  . Lipid panel  . TSH  . HIV antibody (with reflex)  . RPR

## 2015-11-07 NOTE — Patient Instructions (Signed)
Great to see you. We will call you with your lab results and you can view them online.  

## 2015-11-08 LAB — RPR

## 2015-11-08 LAB — HIV ANTIBODY (ROUTINE TESTING W REFLEX): HIV: NONREACTIVE

## 2015-11-09 LAB — CYTOLOGY - PAP

## 2015-11-10 ENCOUNTER — Other Ambulatory Visit: Payer: Self-pay | Admitting: Family Medicine

## 2015-11-10 DIAGNOSIS — D649 Anemia, unspecified: Secondary | ICD-10-CM

## 2015-11-10 LAB — CERVICOVAGINAL ANCILLARY ONLY
Bacterial vaginitis: NEGATIVE
Candida vaginitis: NEGATIVE

## 2015-11-13 ENCOUNTER — Other Ambulatory Visit: Payer: 59

## 2015-11-13 LAB — CERVICOVAGINAL ANCILLARY ONLY: HERPES (WINDOWPATH): NEGATIVE

## 2015-11-16 ENCOUNTER — Other Ambulatory Visit (INDEPENDENT_AMBULATORY_CARE_PROVIDER_SITE_OTHER): Payer: 59

## 2015-11-16 ENCOUNTER — Other Ambulatory Visit: Payer: Self-pay | Admitting: Family Medicine

## 2015-11-16 DIAGNOSIS — D509 Iron deficiency anemia, unspecified: Secondary | ICD-10-CM

## 2015-11-16 DIAGNOSIS — D649 Anemia, unspecified: Secondary | ICD-10-CM | POA: Diagnosis not present

## 2015-11-16 LAB — FERRITIN: Ferritin: 4.2 ng/mL — ABNORMAL LOW (ref 10.0–291.0)

## 2015-11-16 LAB — CBC WITH DIFFERENTIAL/PLATELET
BASOS PCT: 0.7 % (ref 0.0–3.0)
Basophils Absolute: 0.1 10*3/uL (ref 0.0–0.1)
EOS PCT: 1.1 % (ref 0.0–5.0)
Eosinophils Absolute: 0.1 10*3/uL (ref 0.0–0.7)
HEMATOCRIT: 27 % — AB (ref 36.0–46.0)
Hemoglobin: 8.1 g/dL — ABNORMAL LOW (ref 12.0–15.0)
LYMPHS ABS: 3.4 10*3/uL (ref 0.7–4.0)
LYMPHS PCT: 35.4 % (ref 12.0–46.0)
MCHC: 30.2 g/dL (ref 30.0–36.0)
MCV: 60.4 fl — AB (ref 78.0–100.0)
MONOS PCT: 5.7 % (ref 3.0–12.0)
Monocytes Absolute: 0.5 10*3/uL (ref 0.1–1.0)
NEUTROS ABS: 5.4 10*3/uL (ref 1.4–7.7)
Neutrophils Relative %: 57.1 % (ref 43.0–77.0)
PLATELETS: 352 10*3/uL (ref 150.0–400.0)
RBC: 4.47 Mil/uL (ref 3.87–5.11)
RDW: 18.1 % — AB (ref 11.5–15.5)
WBC: 9.5 10*3/uL (ref 4.0–10.5)

## 2015-11-16 LAB — IBC PANEL
Iron: 12 ug/dL — ABNORMAL LOW (ref 42–145)
SATURATION RATIOS: 2.1 % — AB (ref 20.0–50.0)
Transferrin: 411 mg/dL — ABNORMAL HIGH (ref 212.0–360.0)

## 2015-11-16 MED ORDER — FERROUS SULFATE 325 (65 FE) MG PO TABS: 325.0000 mg | ORAL_TABLET | Freq: Every day | ORAL | Status: DC

## 2015-11-16 MED FILL — FERROUS SULFATE 325 MG TAB: 325 (65 FE) | 30 days supply | Qty: 30 | Fill #0

## 2015-12-04 ENCOUNTER — Telehealth: Payer: Self-pay | Admitting: Hematology

## 2015-12-04 NOTE — Telephone Encounter (Signed)
Spoke to [employer] to schedule appt for Dr. Candise Che on 12/22/2015 for 9:00am. Told pt to arrive 30 min prior for registration. Pt confirmed appt.

## 2015-12-20 ENCOUNTER — Other Ambulatory Visit: Payer: Self-pay | Admitting: Family Medicine

## 2015-12-20 MED FILL — SIMVASTATIN 20 MG TABLET: 20 | 90 days supply | Qty: 90 | Fill #0

## 2015-12-22 ENCOUNTER — Encounter: Payer: Self-pay | Admitting: Hematology

## 2015-12-22 ENCOUNTER — Telehealth: Payer: Self-pay | Admitting: Hematology

## 2015-12-22 ENCOUNTER — Ambulatory Visit (HOSPITAL_BASED_OUTPATIENT_CLINIC_OR_DEPARTMENT_OTHER): Payer: 59 | Admitting: Hematology

## 2015-12-22 ENCOUNTER — Other Ambulatory Visit (HOSPITAL_BASED_OUTPATIENT_CLINIC_OR_DEPARTMENT_OTHER): Payer: 59

## 2015-12-22 VITALS — BP 134/97 | HR 70 | Temp 98.6°F | Resp 18 | Ht 61.5 in | Wt 155.1 lb

## 2015-12-22 DIAGNOSIS — D500 Iron deficiency anemia secondary to blood loss (chronic): Secondary | ICD-10-CM | POA: Insufficient documentation

## 2015-12-22 DIAGNOSIS — D509 Iron deficiency anemia, unspecified: Secondary | ICD-10-CM

## 2015-12-22 DIAGNOSIS — N920 Excessive and frequent menstruation with regular cycle: Secondary | ICD-10-CM | POA: Diagnosis not present

## 2015-12-22 LAB — CBC & DIFF AND RETIC
BASO%: 0.5 % (ref 0.0–2.0)
Basophils Absolute: 0 10*3/uL (ref 0.0–0.1)
EOS ABS: 0.1 10*3/uL (ref 0.0–0.5)
EOS%: 1 % (ref 0.0–7.0)
HCT: 36.4 % (ref 34.8–46.6)
HEMOGLOBIN: 11.3 g/dL — AB (ref 11.6–15.9)
IMMATURE RETIC FRACT: 2.7 % (ref 1.60–10.00)
LYMPH%: 32 % (ref 14.0–49.7)
MCH: 21.9 pg — AB (ref 25.1–34.0)
MCHC: 31 g/dL — ABNORMAL LOW (ref 31.5–36.0)
MCV: 70.5 fL — AB (ref 79.5–101.0)
MONO#: 0.5 10*3/uL (ref 0.1–0.9)
MONO%: 8.2 % (ref 0.0–14.0)
NEUT#: 3.5 10*3/uL (ref 1.5–6.5)
NEUT%: 58.3 % (ref 38.4–76.8)
PLATELETS: 321 10*3/uL (ref 145–400)
RBC: 5.16 10*6/uL (ref 3.70–5.45)
RETIC %: 1.12 % (ref 0.70–2.10)
Retic Ct Abs: 57.79 10*3/uL (ref 33.70–90.70)
WBC: 6 10*3/uL (ref 3.9–10.3)
lymph#: 1.9 10*3/uL (ref 0.9–3.3)

## 2015-12-22 LAB — COMPREHENSIVE METABOLIC PANEL
ALBUMIN: 4.1 g/dL (ref 3.5–5.0)
ALT: 14 U/L (ref 0–55)
ANION GAP: 7 meq/L (ref 3–11)
AST: 16 U/L (ref 5–34)
Alkaline Phosphatase: 47 U/L (ref 40–150)
BUN: 11.9 mg/dL (ref 7.0–26.0)
CALCIUM: 9.6 mg/dL (ref 8.4–10.4)
CO2: 23 meq/L (ref 22–29)
CREATININE: 0.8 mg/dL (ref 0.6–1.1)
Chloride: 111 mEq/L — ABNORMAL HIGH (ref 98–109)
Glucose: 91 mg/dl (ref 70–140)
POTASSIUM: 3.7 meq/L (ref 3.5–5.1)
SODIUM: 141 meq/L (ref 136–145)
TOTAL PROTEIN: 7.5 g/dL (ref 6.4–8.3)
Total Bilirubin: 0.41 mg/dL (ref 0.20–1.20)

## 2015-12-22 LAB — TECHNOLOGIST REVIEW

## 2015-12-22 LAB — FERRITIN: FERRITIN: 28 ng/mL (ref 9–269)

## 2015-12-22 MED ORDER — B COMPLEX VITAMINS PO CAPS: 1.0000 | ORAL_CAPSULE | Freq: Every day | ORAL | Status: AC

## 2015-12-22 MED ORDER — FERROUS SULFATE 325 (65 FE) MG PO TABS: 325.0000 mg | ORAL_TABLET | Freq: Three times a day (TID) | ORAL | Status: DC

## 2015-12-22 NOTE — Progress Notes (Signed)
Marland Kitchen    HEMATOLOGY/ONCOLOGY CONSULTATION NOTE  Date of Service: 12/22/2015  Patient Care Team: Dianne Dun, MD as PCP - General  CHIEF COMPLAINTS/PURPOSE OF CONSULTATION:  evaluation and management of anemia  HISTORY OF PRESENTING ILLNESS:   Tina Cooper is a wonderful 35 y.o. female who has been referred to [place] by Dr .Ruthe Mannan, MD for evaluation and management of anemia.  Patient is an overall healthy 35 year old female with a history of dyslipidemiawho notes she has been having heavy periods lasting about a week with significant passage of clots for the last few years.  She notes that she has not been evaluated by her GYN doctor for this. Has never been pregnant and notes that she has never had miscarriages.. She notes no other evidence of easy bruisability or bleeding to suggest a bleeding disorder.  Her labs with her primary care physician about a month ago showed that her hemoglobin was 8.1  With an MCV of 60 and RDW of 18.1 with normal WBC and platelet counts.her ferritin level was 4.2  And her iron saturation was 2%. She notes significant fatigue, ice craving.  Notes thinning of her hair and some brittleness of her nails. She notes no overt GI bleeding, black stools or blood in the stools.  No hematuria.  No significant epistaxis or gum bleeding.  Notes that she uses ibuprofen 2-3 times a month for headaches. No other acute new symptoms.  Has start taking ferrous sulfate one tablet by mouth daily within last month and notes some constipation and black stools.  MEDICAL HISTORY:  HLD   SURGICAL HISTORY: No sx  SOCIAL HISTORY: Social History   Social History  . Marital Status: Single    Spouse Name: N/A  . Number of Children: N/A  . Years of Education: N/A   Occupational History  . RN [employer]   Social History Main Topics  . Smoking status: Never Smoker   . Smokeless tobacco: Never Used  . Alcohol Use: No  . Drug Use: No  . Sexual Activity: Not on file    Other Topics Concern  . Not on file   Social History Narrative    FAMILY HISTORY: Family History  Problem Relation Age of Onset  . Diabetes Mother   . Hypertension Mother   . Hyperlipidemia Father   . Hypertension Father     ALLERGIES:  has No Known Allergies.  MEDICATIONS:  Current Outpatient Prescriptions  Medication Sig Dispense Refill  . b complex vitamins capsule Take 1 capsule by mouth daily. 30 capsule 3  . ferrous sulfate 325 (65 FE) MG tablet Take 1 tablet (325 mg total) by mouth 3 (three) times daily with meals. 90 tablet 3  . fluticasone (FLONASE) 50 MCG/ACT nasal spray Place 2 sprays into both nostrils daily. 16 g 6  . simvastatin (ZOCOR) 20 MG tablet TAKE 1 TABLET BY MOUTH EVERY EVENING 90 tablet 1   No current facility-administered medications for this visit.    REVIEW OF SYSTEMS:    10 Point review of Systems was done is negative except as noted above.  PHYSICAL EXAMINATION: ECOG PERFORMANCE STATUS: 1 - Symptomatic but completely ambulatory  . Filed Vitals:   12/22/15 0919 12/22/15 0920  BP: 151/95 134/97  Pulse: 70   Temp: 98.6 F (37 C)   Resp: 18    Filed Weights   12/22/15 0919  Weight: 155 lb 1.6 oz (70.353 kg)   .Body mass index is 28.84 kg/(m^2).  GENERAL:alert, in no acute distress and comfortable SKIN: skin color, texture, turgor are normal, no rashes or significant lesions EYES: normal, conjunctiva are pink and non-injected, sclera clear OROPHARYNX:no exudate, no erythema and lips, buccal mucosa, and tongue normal  NECK: supple, no JVD, thyroid normal size, non-tender, without nodularity LYMPH:  no palpable lymphadenopathy in the cervical, axillary or inguinal LUNGS: clear to auscultation with normal respiratory effort HEART: regular rate & rhythm,  no murmurs and no lower extremity edema ABDOMEN: abdomen soft, non-tender, normoactive bowel sounds  Musculoskeletal: no cyanosis of digits and no clubbing  PSYCH: alert & oriented x  3 with fluent speech NEURO: no focal motor/sensory deficits  LABORATORY DATA:  I have reviewed the data as listed  . CBC Latest Ref Rng 12/22/2015 11/16/2015 11/07/2015  WBC 3.9 - 10.3 10e3/uL 6.0 9.5 8.5  Hemoglobin 11.6 - 15.9 g/dL 11.3(L) 8.1 Repeated and verified X2.(L) 8.4 Repeated and verified X2.(L)  Hematocrit 34.8 - 46.6 % 36.4 27.0(L) 27.9(L)  Platelets 145 - 400 10e3/uL 321 352.0 434.0(H)   . CBC    Component Value Date/Time   WBC 6.0 12/22/2015 1015   WBC 9.5 11/16/2015 1153   RBC 5.16 12/22/2015 1015   RBC 4.47 11/16/2015 1153   HGB 11.3* 12/22/2015 1015   HGB 8.1 Repeated and verified X2.* 11/16/2015 1153   HCT 36.4 12/22/2015 1015   HCT 27.0* 11/16/2015 1153   PLT 321 12/22/2015 1015   PLT 352.0 11/16/2015 1153   MCV 70.5* 12/22/2015 1015   MCV 60.4* 11/16/2015 1153   MCH 21.9* 12/22/2015 1015   MCHC 31.0* 12/22/2015 1015   MCHC 30.2 11/16/2015 1153   RDW 18.1* 11/16/2015 1153   LYMPHSABS 1.9 12/22/2015 1015   LYMPHSABS 3.4 11/16/2015 1153   MONOABS 0.5 12/22/2015 1015   MONOABS 0.5 11/16/2015 1153   EOSABS 0.1 12/22/2015 1015   EOSABS 0.1 11/16/2015 1153   BASOSABS 0.0 12/22/2015 1015   BASOSABS 0.1 11/16/2015 1153     . CMP Latest Ref Rng 12/22/2015 11/07/2015 11/15/2014  Glucose 70 - 140 mg/dl 91 91 91  BUN 7.0 - 16.126.0 mg/dL 09.$UEAVWUJWJXBJYNWG_NFAOZHYQMVHQIONGEXBMWUXLKGMWNUUV$$OZDGUYQIHKVQQVZD_GLOVFIEPPIRJJOACZYSAYTKZSWFUXNAT$11.9 11 13   Creatinine 0.6 - 1.1 mg/dL 0.8 5.570.70 3.220.84  Sodium 025136 - 145 mEq/L 141 138 138  Potassium 3.5 - 5.1 mEq/L 3.7 3.8 4.1  Chloride 96 - 112 mEq/L - 105 106  CO2 22 - 29 mEq/L 23 27 27   Calcium 8.4 - 10.4 mg/dL 9.6 9.4 9.5  Total Protein 6.4 - 8.3 g/dL 7.5 7.5 7.1  Total Bilirubin 0.20 - 1.20 mg/dL 4.270.41 0.4 0.3  Alkaline Phos 40 - 150 U/L 47 43 46  AST 5 - 34 U/L 16 19 15   ALT 0 - 55 U/L 14 13 11    . Lab Results  Component Value Date   IRON 12* 11/16/2015   IRONPCTSAT 2.1* 11/16/2015   (Iron and TIBC)  Lab Results  Component Value Date   FERRITIN 4.2* 11/16/2015    . Lab Results  Component Value Date    IRON 12* 11/16/2015   IRONPCTSAT 2.1* 11/16/2015   (Iron and TIBC)  Lab Results  Component Value Date   FERRITIN 28 12/22/2015     RADIOGRAPHIC STUDIES: I have personally reviewed the radiological images as listed and agreed with the findings in the report. No results found.  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 #2 severe iron deficiency likely due to heavy menstrual losses. #3  menorrhagia - unknown etiology ?dysfunctional uterine bleeding. Plan  -I had an extensive discussion with the patient regarding treatment options for her iron deficiency anemia. -We discussed increasing the dose of her oral iron replacement versus replacing her severe iron deficiency more rapidly with IV iron. -given that her hemoglobin has improved from 8.1 to 11.3 and her ferritin levels are going up appropriately there does not seem to be any obvious issues with iron absorption. -As a result of her improvement and tolerance of oral iron and we shall increase her ferrous sulfate to 1 tablet 3 times daily with orange juice. -She was also recommended to take 1 capsule a B complex daily to support accelerated hematopoiesis. -She will call us if she has an intolerance to the oral iron. -Given gynecology referral for evaluation and treatment of menorrhagia.  Return to care with Dr. Candise Che in 2 months with repeat CBC, CMP, ferritin, iron profile.  Orders Placed This Encounter  Procedures  . CBC & Diff and Retic    Standing Status: Future     Number of Occurrences: 1     Standing Expiration Date: 01/25/2017  . Comprehensive metabolic panel    Standing Status: Future     Number of Occurrences: 1     Standing Expiration Date: 12/21/2016  . Ferritin    Standing Status: Future     Number of Occurrences: 1     Standing Expiration Date: 12/21/2016  . Iron and TIBC    Standing Status: Future     Number of Occurrences: 1      Standing Expiration Date: 12/21/2016  . Celiac panel 10    Standing Status: Future     Number of Occurrences: 1     Standing Expiration Date: 12/21/2016  . CBC & Diff and Retic    Standing Status: Future     Number of Occurrences:      Standing Expiration Date: 01/25/2017  . Comprehensive metabolic panel    Standing Status: Future     Number of Occurrences:      Standing Expiration Date: 12/21/2016  . Ferritin    Standing Status: Future     Number of Occurrences:      Standing Expiration Date: 12/21/2016  . Iron and TIBC    Standing Status: Future     Number of Occurrences:      Standing Expiration Date: 12/21/2016  . Ambulatory referral to Gynecology    Referral Priority:  Urgent    Referral Type:  Consultation    Referral Reason:  Specialty Services Required    Requested Specialty:  Gynecology    Number of Visits Requested:  1    All of the patients questions were answered with apparent satisfaction. The patient knows to call the clinic with any problems, questions or concerns.  I spent 60 minutes counseling the patient face to face. The total time spent in the appointment was 60 minutes and more than 50% was on counseling, patient education and direct patient cares.    Wyvonnia Lora MD MS AAHIVMS [facility] Hematology/Oncology Physician [facility]  (Office):       [phone] (Work cell):  [phone] (Fax):           [phone]  12/22/2015 9:23 AM

## 2015-12-22 NOTE — Telephone Encounter (Signed)
Gave and printed appt sched and avs fo rpt for May °

## 2015-12-26 ENCOUNTER — Telehealth: Payer: Self-pay | Admitting: Obstetrics and Gynecology

## 2015-12-26 LAB — CELIAC PANEL 10
ANTIGLIADIN ABS, IGA: 3 U (ref 0–19)
ENDOMYSIAL IGA: NEGATIVE
GLIADIN IGG: 5 U (ref 0–19)
IgA, Qn, Serum: 127 mg/dL (ref 87–352)
Transglutaminase IgA: <2 U/mL (ref 0–3)
t-Transglutaminase (tTG) IgG: 3 U/mL (ref 0–5)

## 2015-12-26 NOTE — Telephone Encounter (Signed)
Called and left a message for patient to call back to schedule a new patient doctor referral. °

## 2015-12-27 NOTE — Telephone Encounter (Signed)
Called but could not leave a message to call back because the "mailbox is full."

## 2015-12-28 NOTE — Telephone Encounter (Signed)
Patient declined to schedule an appointment with our office. She said, "I have someone in mind in the [place] area and will call to request a referral to them when I am ready." Patient did not know the name of the practice or doctor. Routing referral back to referring office for follow up with patient.

## 2016-01-18 ENCOUNTER — Other Ambulatory Visit: Payer: Self-pay | Admitting: Family Medicine

## 2016-01-18 MED FILL — FLUTICASONE PROP 50 MCG SPR: 50 | 30 days supply | Qty: 16 | Fill #0

## 2016-02-08 ENCOUNTER — Encounter: Payer: Self-pay | Admitting: Obstetrics and Gynecology

## 2016-02-08 ENCOUNTER — Ambulatory Visit (INDEPENDENT_AMBULATORY_CARE_PROVIDER_SITE_OTHER): Payer: 59 | Admitting: Obstetrics and Gynecology

## 2016-02-08 VITALS — BP 133/91 | HR 88 | Resp 18 | Ht 62.0 in | Wt 159.0 lb

## 2016-02-08 DIAGNOSIS — N920 Excessive and frequent menstruation with regular cycle: Secondary | ICD-10-CM | POA: Diagnosis not present

## 2016-02-08 NOTE — Progress Notes (Signed)
Pt here today for heavy menstrual cycles, was seen by her PCP and hematologist for low Hgb.  Recommended a work-up with an OB-GYN to treat her heavy menstrual cycles and see if that would help her anemia.

## 2016-02-08 NOTE — Progress Notes (Signed)
   Subjective:    Patient ID: Tina Cooper, female    DOB: March 30, 1981, 35 y.o.   MRN: [id number]  HPI 35 yo G0 here for evaluation of heavy menstrual cycles causing anemia. Patient reports regular monthly cycles lasting 5 days which are heavy with passage of clots. She also states that her cycles are associated with dysmenorrhea which is well managed with ibuprofen. Patient reports the onset of this heavier pattern in her cycle approximately 2 years ago. She is sexually active without contraception. She is not trying to conceive. She denies feeling dizzy or lightheaded. She is without any other complaints.  Past Medical History  Diagnosis Date  . Hyperlipidemia   . Anemia    Past Surgical History  Procedure Laterality Date  . Wisdom tooth extraction     Family History  Problem Relation Age of Onset  . Diabetes Mother   . Hypertension Mother   . Hyperlipidemia Father   . Hypertension Father    Social History  Substance Use Topics  . Smoking status: Never Smoker   . Smokeless tobacco: Never Used  . Alcohol Use: 0.0 oz/week    0 Standard drinks or equivalent per week     Comment: occasional      Review of Systems See pertinent in HPI    Objective:   Physical Exam Blood pressure 133/91, pulse 88, resp. rate 18, height 5\' 2"  (1.575 m), weight 159 lb (72.122 kg), last menstrual period 01/18/2016.  GENERAL: Well-developed, well-nourished female in no acute distress.  ABDOMEN: Soft, nontender, nondistended.  PELVIC: Normal external female genitalia. Vagina is pink and rugated.  Normal discharge. Normal appearing cervix. Uterus is normal in size. No adnexal mass or tenderness. EXTREMITIES: No cyanosis, clubbing, or edema, 2+ distal pulses.       Assessment & Plan:  35 yo G0 here for the evaluation of menorrhagia - Will order pelvic ultrasound to rule out the presence of fibroid or polyp - Her anemia has resolved with iron supplements - Discussed medical management with  contraception to decrease the flow of her menses or render her amenorrheic has been discussed. Patient is not interested in initiating contraception - Patient will be contacted with results of the ultrasound - Patient to follow up as needed

## 2016-02-08 NOTE — Patient Instructions (Signed)

## 2016-02-14 ENCOUNTER — Ambulatory Visit (HOSPITAL_COMMUNITY)
Admission: RE | Admit: 2016-02-14 | Discharge: 2016-02-14 | Disposition: A | Discharge status: Home / Self Care | Payer: 59 | Source: Ambulatory Visit | Attending: Obstetrics and Gynecology | Admitting: Obstetrics and Gynecology

## 2016-02-14 ENCOUNTER — Telehealth: Payer: Self-pay | Admitting: *Deleted

## 2016-02-14 DIAGNOSIS — N920 Excessive and frequent menstruation with regular cycle: Secondary | ICD-10-CM | POA: Insufficient documentation

## 2016-02-14 DIAGNOSIS — N924 Excessive bleeding in the premenopausal period: Secondary | ICD-10-CM | POA: Diagnosis not present

## 2016-02-14 NOTE — Telephone Encounter (Signed)
-----   Message from Catalina AntiguaPeggy Constant, MD sent at 02/14/2016  3:52 PM EDT ----- Please inform the patient of a normal ultrasound. There were no fibroids or polyps identified to explain her heavy menses. Both ovaries also appear to be normal.  The only explanation for her heavy menses is a hormonal cause. As discussed, this can be treated with contraception to regulate the flow of your menses or to cause you not to have a period. Patient may return to the office to discuss contraception options or she may continue her regular use of iron supplements  Thanks  MariemouthPeggy

## 2016-02-14 NOTE — Telephone Encounter (Signed)
Informed pt of normal US result and that bleeding was likely a hormonal cause.  Informed pt that treatment regimen would be with contraception.  Pt declined any contraception at this time.  Instructed to call office if she decided on treatment at a later time.  Pt acknowledged.

## 2016-02-16 ENCOUNTER — Ambulatory Visit (HOSPITAL_BASED_OUTPATIENT_CLINIC_OR_DEPARTMENT_OTHER): Payer: 59 | Admitting: Hematology

## 2016-02-16 ENCOUNTER — Other Ambulatory Visit: Payer: Self-pay | Admitting: *Deleted

## 2016-02-16 ENCOUNTER — Encounter: Payer: Self-pay | Admitting: Hematology

## 2016-02-16 ENCOUNTER — Other Ambulatory Visit (HOSPITAL_BASED_OUTPATIENT_CLINIC_OR_DEPARTMENT_OTHER): Payer: 59

## 2016-02-16 VITALS — BP 151/94 | HR 72 | Temp 98.0°F | Resp 20 | Ht 62.0 in | Wt 162.7 lb

## 2016-02-16 DIAGNOSIS — D509 Iron deficiency anemia, unspecified: Secondary | ICD-10-CM | POA: Diagnosis not present

## 2016-02-16 DIAGNOSIS — N920 Excessive and frequent menstruation with regular cycle: Secondary | ICD-10-CM

## 2016-02-16 DIAGNOSIS — D500 Iron deficiency anemia secondary to blood loss (chronic): Secondary | ICD-10-CM

## 2016-02-16 LAB — CBC & DIFF AND RETIC
BASO%: 0.6 % (ref 0.0–2.0)
Basophils Absolute: 0 10*3/uL (ref 0.0–0.1)
EOS ABS: 0.1 10*3/uL (ref 0.0–0.5)
EOS%: 1.8 % (ref 0.0–7.0)
HEMATOCRIT: 38 % (ref 34.8–46.6)
HEMOGLOBIN: 12.5 g/dL (ref 11.6–15.9)
IMMATURE RETIC FRACT: 4.9 % (ref 1.60–10.00)
LYMPH#: 1.9 10*3/uL (ref 0.9–3.3)
LYMPH%: 26.7 % (ref 14.0–49.7)
MCH: 26.5 pg (ref 25.1–34.0)
MCHC: 32.9 g/dL (ref 31.5–36.0)
MCV: 80.7 fL (ref 79.5–101.0)
MONO#: 0.6 10*3/uL (ref 0.1–0.9)
MONO%: 7.9 % (ref 0.0–14.0)
NEUT#: 4.5 10*3/uL (ref 1.5–6.5)
NEUT%: 63 % (ref 38.4–76.8)
PLATELETS: 286 10*3/uL (ref 145–400)
RBC: 4.71 10*6/uL (ref 3.70–5.45)
RDW: 21 % — AB (ref 11.2–14.5)
RETIC %: 1.52 % (ref 0.70–2.10)
RETIC CT ABS: 71.59 10*3/uL (ref 33.70–90.70)
WBC: 7.1 10*3/uL (ref 3.9–10.3)

## 2016-02-16 LAB — COMPREHENSIVE METABOLIC PANEL
ALBUMIN: 4.1 g/dL (ref 3.5–5.0)
ALT: 18 U/L (ref 0–55)
ANION GAP: 9 meq/L (ref 3–11)
AST: 14 U/L (ref 5–34)
Alkaline Phosphatase: 53 U/L (ref 40–150)
BUN: 16.8 mg/dL (ref 7.0–26.0)
CALCIUM: 9.5 mg/dL (ref 8.4–10.4)
CHLORIDE: 108 meq/L (ref 98–109)
CO2: 24 mEq/L (ref 22–29)
CREATININE: 1 mg/dL (ref 0.6–1.1)
EGFR: >90 mL/min/{1.73_m2} (ref >90)
Glucose: 87 mg/dl (ref 70–140)
Potassium: 3.8 mEq/L (ref 3.5–5.1)
Sodium: 141 mEq/L (ref 136–145)
TOTAL PROTEIN: 7.3 g/dL (ref 6.4–8.3)

## 2016-02-16 LAB — IRON AND TIBC
%SAT: 11 % — AB (ref 21–57)
IRON: 36 ug/dL — AB (ref 41–142)
TIBC: 329 ug/dL (ref 236–444)
UIBC: 293 ug/dL (ref 120–384)

## 2016-02-16 LAB — FERRITIN: FERRITIN: 32 ng/mL (ref 9–269)

## 2016-02-16 NOTE — Progress Notes (Signed)
Marland Kitchen    HEMATOLOGY/ONCOLOGY CLINIC NOTE  Date of Service: 02/16/2016  Patient Care Team: Dianne Dun, MD as PCP - General  CHIEF COMPLAINTS/PURPOSE OF CONSULTATION:  evaluation and management of anemia  HISTORY OF PRESENTING ILLNESS:   Tina Cooper is a wonderful 35 y.o. female who has been referred to [place] by Dr .Ruthe Mannan, MD for evaluation and management of anemia.  Patient is an overall healthy 35 year old female with a history of dyslipidemiawho notes she has been having heavy periods lasting about a week with significant passage of clots for the last few years.  She notes that she has not been evaluated by her GYN doctor for this. Has never been pregnant and notes that she has never had miscarriages.. She notes no other evidence of easy bruisability or bleeding to suggest a bleeding disorder.  Her labs with her primary care physician about a month ago showed that her hemoglobin was 8.1  With an MCV of 60 and RDW of 18.1 with normal WBC and platelet counts.her ferritin level was 4.2  And her iron saturation was 2%. She notes significant fatigue, ice craving.  Notes thinning of her hair and some brittleness of her nails. She notes no overt GI bleeding, black stools or blood in the stools.  No hematuria.  No significant epistaxis or gum bleeding.  Notes that she uses ibuprofen 2-3 times a month for headaches. No other acute new symptoms.  Has start taking ferrous sulfate one tablet by mouth daily within last month and notes some constipation and black stools.  INTERVAL HISTORY  Ms. Martorelli is here for follow-up for her iron deficiency anemia. Her hemoglobin today is improved to the normal range of 12.5. She notes that she feels that her with regards to her energy levels. She was evaluated by GYN for heavy periods and was offered both control pills which she chooses to hold off on. Had an ultrasound of her pelvis that did not show any fibroids or other overt uterine abnormalities. Has  dysmenorrhea which she is comfortable managing with ibuprofen. No other acute new symptoms at this time. Notes some mild constipation and darkening of stools with her oral iron.  MEDICAL HISTORY:  HLD   SURGICAL HISTORY: No sx  SOCIAL HISTORY: Social History   Social History  . Marital Status: Single    Spouse Name: N/A  . Number of Children: N/A  . 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 Smoking status: Never Smoker   . Smokeless tobacco: Never Used  . Alcohol Use: 0.0 oz/week    0 Standard drinks or equivalent per week     Comment: occasional  . Drug Use: No  . Sexual Activity: Yes    Birth Control/ Protection: None   Other Topics Concern  . Not on file   Social History Narrative    FAMILY HISTORY: Family History  Problem Relation Age of Onset  . Diabetes Mother   . Hypertension Mother   . Hyperlipidemia Father   . Hypertension Father     ALLERGIES:  has No Known Allergies.  MEDICATIONS:  Current Outpatient Prescriptions  Medication Sig Dispense Refill  . b complex vitamins capsule Take 1 capsule by mouth daily. 30 capsule 3  . ferrous sulfate 325 (65 FE) MG tablet Take 1 tablet (325 mg total) by mouth 3 (three) times daily with meals. 90 tablet 3  . fluticasone (FLONASE)  50 MCG/ACT nasal spray PLACE 2 SPRAYS INTO BOTH NOSTRILS DAILY. 16 g 6  . simvastatin (ZOCOR) 20 MG tablet TAKE 1 TABLET BY MOUTH EVERY EVENING 90 tablet 1   No current facility-administered medications for this visit.    REVIEW OF SYSTEMS:    10 Point review of Systems was done is negative except as noted above.  PHYSICAL EXAMINATION: ECOG PERFORMANCE STATUS: 1 - Symptomatic but completely ambulatory  . Filed Vitals:   02/16/16 0914  BP: 151/94  Pulse: 72  Temp: 98 F (36.7 C)  Resp: 20   Filed Weights   02/16/16 0914  Weight: 162 lb 11.2 oz (73.8 kg)   .Body mass index is 29.75 kg/(m^2).  GENERAL:alert, in  no acute distress and comfortable SKIN: skin color, texture, turgor are normal, no rashes or significant lesions EYES: normal, conjunctiva are pink and non-injected, sclera clear OROPHARYNX:no exudate, no erythema and lips, buccal mucosa, and tongue normal  NECK: supple, no JVD, thyroid normal size, non-tender, without nodularity LYMPH:  no palpable lymphadenopathy in the cervical, axillary or inguinal LUNGS: clear to auscultation with normal respiratory effort HEART: regular rate & rhythm,  no murmurs and no lower extremity edema ABDOMEN: abdomen soft, non-tender, normoactive bowel sounds  Musculoskeletal: no cyanosis of digits and no clubbing  PSYCH: alert & oriented x 3 with fluent speech NEURO: no focal motor/sensory deficits  LABORATORY DATA:  I have reviewed the data as listed  . CBC Latest Ref Rng 02/16/2016 12/22/2015 11/16/2015  WBC 3.9 - 10.3 10e3/uL 7.1 6.0 9.5  Hemoglobin 11.6 - 15.9 g/dL 16.112.5 11.3(L) 8.1 Repeated and verified X2.(L)  Hematocrit 34.8 - 46.6 % 38.0 36.4 27.0(L)  Platelets 145 - 400 10e3/uL 286 321 352.0   . CBC    Component Value Date/Time   WBC 7.1 02/16/2016 0900   WBC 9.5 11/16/2015 1153   RBC 4.71 02/16/2016 0900   RBC 4.47 11/16/2015 1153   HGB 12.5 02/16/2016 0900   HGB 8.1 Repeated and verified X2.* 11/16/2015 1153   HCT 38.0 02/16/2016 0900   HCT 27.0* 11/16/2015 1153   PLT 286 02/16/2016 0900   PLT 352.0 11/16/2015 1153   MCV 80.7 02/16/2016 0900   MCV 60.4* 11/16/2015 1153   MCH 26.5 02/16/2016 0900   MCHC 32.9 02/16/2016 0900   MCHC 30.2 11/16/2015 1153   RDW 21.0* 02/16/2016 0900   RDW 18.1* 11/16/2015 1153   LYMPHSABS 1.9 02/16/2016 0900   LYMPHSABS 3.4 11/16/2015 1153   MONOABS 0.6 02/16/2016 0900   MONOABS 0.5 11/16/2015 1153   EOSABS 0.1 02/16/2016 0900   EOSABS 0.1 11/16/2015 1153   BASOSABS 0.0 02/16/2016 0900   BASOSABS 0.1 11/16/2015 1153     . CMP Latest Ref Rng 12/22/2015 11/07/2015 11/15/2014  Glucose 70 - 140 mg/dl  91 91 91  BUN 7.0 - 09.626.0 mg/dL 04.$VWUJWJXBJYNWGNFA_OZHYQMVHQIONGEXBMWUXLKGMWNUUVOZD$$GUYQIHKVQQVZDGLO_VFIEPPIRJJOACZYSAYTKZSWFUXNATFTD$11.9 11 13   Creatinine 0.6 - 1.1 mg/dL 0.8 3.220.70 0.250.84  Sodium 427136 - 145 mEq/L 141 138 138  Potassium 3.5 - 5.1 mEq/L 3.7 3.8 4.1  Chloride 96 - 112 mEq/L - 105 106  CO2 22 - 29 mEq/L 23 27 27   Calcium 8.4 - 10.4 mg/dL 9.6 9.4 9.5  Total Protein 6.4 - 8.3 g/dL 7.5 7.5 7.1  Total Bilirubin 0.20 - 1.20 mg/dL 0.620.41 0.4 0.3  Alkaline Phos 40 - 150 U/L 47 43 46  AST 5 - 34 U/L 16 19 15   ALT 0 - 55 U/L 14 13 11    . Lab Results  Component  Value Date   IRON 36* 02/16/2016   TIBC 329 02/16/2016   IRONPCTSAT 11* 02/16/2016   (Iron and TIBC)  Lab Results  Component Value Date   FERRITIN 32 02/16/2016      RADIOGRAPHIC STUDIES: I have personally reviewed the radiological images as listed and agreed with the findings in the report. US Transvaginal Non-ob  02/14/2016  CLINICAL DATA:  Menorrhagia with regular cycles EXAM: TRANSABDOMINAL AND TRANSVAGINAL ULTRASOUND OF PELVIS TECHNIQUE: Both transabdominal and transvaginal ultrasound examinations of the pelvis were performed. Transabdominal technique was performed for global imaging of the pelvis including uterus, ovaries, adnexal regions, and pelvic cul-de-sac. It was necessary to proceed with endovaginal exam following the transabdominal exam to visualize the uterus, endometrium, ovaries and adnexa . COMPARISON:  None FINDINGS: Uterus Measurements: 8.0 x 4.2 x 5.3 cm. No fibroids or other mass visualized. Endometrium Thickness: 9 mm in thickness.  No focal abnormality visualized. Right ovary Measurements: 2.8 x 1.5 x 1.5 cm. Normal appearance/no adnexal mass. Left ovary Measurements: 2.8 x 1.4 x 1.9 cm. Normal appearance/no adnexal mass. Other findings No abnormal free fluid. IMPRESSION: Unremarkable pelvic ultrasound. Electronically Signed   By: Charlett Nose M.D.   On: 02/14/2016 15:34   US Pelvis Complete  02/14/2016  CLINICAL DATA:  Menorrhagia with regular cycles EXAM: TRANSABDOMINAL AND TRANSVAGINAL ULTRASOUND OF  PELVIS TECHNIQUE: Both transabdominal and transvaginal ultrasound examinations of the pelvis were performed. Transabdominal technique was performed for global imaging of the pelvis including uterus, ovaries, adnexal regions, and pelvic cul-de-sac. It was necessary to proceed with endovaginal exam following the transabdominal exam to visualize the uterus, endometrium, ovaries and adnexa . COMPARISON:  None FINDINGS: Uterus Measurements: 8.0 x 4.2 x 5.3 cm. No fibroids or other mass visualized. Endometrium Thickness: 9 mm in thickness.  No focal abnormality visualized. Right ovary Measurements: 2.8 x 1.5 x 1.5 cm. Normal appearance/no adnexal mass. Left ovary Measurements: 2.8 x 1.4 x 1.9 cm. Normal appearance/no adnexal mass. Other findings No abnormal free fluid. IMPRESSION: Unremarkable pelvic ultrasound. Electronically Signed   By: Charlett Nose M.D.   On: 02/14/2016 15:34    ASSESSMENT & PLAN:   35 year old African American female with  #1 Microcytic hypochromic anemia due to severe iron deficiency. Resolved with oral iron replacement. #2 Severe iron deficiency likely due to heavy menstrual losses. Improved. #3 Menorrhagia - unknown etiology ?dysfunctional uterine bleeding. Pelvic ultrasound unremarkable. Seen by GYN. She was offered the use of OCPs which she wants to hold off on. Ferritin 32 Plan -Discussed the lab results with the patient. -Patient may reduce her ferrous sulfate to 1 tablet by mouth twice a day. -She was given recommendations to consider switching to iron polysaccharide at a similar dose if the ferrous sulfate causes GI discomfort. -Follow-up with primary care physician Dr. Dayton Martes in 3 months to repeat CBC and ferritin. -If ferritin levels are above 50 patient might reduce to ferrous sulfate 1 tablet by mouth daily or alternatively switch to a multivitamin with iron.  Continue follow-up with primary care physician Return to care with Dr. Candise Che on an as-needed basis if any new  questions or concerns arise.  Wyvonnia Lora MD MS AAHIVMS [facility] Hematology/Oncology Physician [facility]  (Office):       [phone] (Work cell):  [phone] (Fax):           [phone]  02/16/2016 9:43 AM

## 2016-03-28 MED FILL — SIMVASTATIN 20 MG TABLET: 20 | 90 days supply | Qty: 90 | Fill #1

## 2016-05-22 ENCOUNTER — Encounter: Payer: Self-pay | Admitting: Family Medicine

## 2016-05-22 ENCOUNTER — Other Ambulatory Visit: Payer: Self-pay | Admitting: Family Medicine

## 2016-05-22 MED ORDER — HYDROCHLOROTHIAZIDE 12.5 MG PO CAPS: ORAL_CAPSULE | ORAL | 0 refills | Status: DC

## 2016-05-22 MED FILL — HYDROCHLOROTHIAZIDE 12.5 MG: 12.5 | 20 days supply | Qty: 20 | Fill #0

## 2016-06-03 MED FILL — FLUTICASONE PROP 50 MCG SPR: 50 | 30 days supply | Qty: 16 | Fill #1

## 2016-07-09 ENCOUNTER — Other Ambulatory Visit: Payer: Self-pay | Admitting: Family Medicine

## 2016-07-09 MED FILL — SIMVASTATIN 20 MG TABLET: 20 | 90 days supply | Qty: 90 | Fill #0

## 2016-07-09 NOTE — Telephone Encounter (Signed)
Last labs abnormal. pls advise 

## 2016-10-04 MED FILL — FLUTICASONE PROP 50 MCG SPR: 50 | 30 days supply | Qty: 16 | Fill #2

## 2016-10-17 MED FILL — SIMVASTATIN 20 MG TABLET: 20 | 90 days supply | Qty: 90 | Fill #1

## 2017-01-21 ENCOUNTER — Other Ambulatory Visit: Payer: Self-pay | Admitting: Family Medicine

## 2017-01-22 ENCOUNTER — Other Ambulatory Visit: Payer: Self-pay | Admitting: Family Medicine

## 2017-01-22 MED FILL — SIMVASTATIN 20 MG TABLET: 20 | 90 days supply | Qty: 90 | Fill #0

## 2017-01-22 MED FILL — FLUTICASONE PROP 50 MCG SPR: 50 | 30 days supply | Qty: 16 | Fill #0

## 2017-05-13 ENCOUNTER — Other Ambulatory Visit: Payer: Self-pay | Admitting: Family Medicine

## 2017-05-13 MED FILL — SIMVASTATIN 20 MG TABLET: 20 | 30 days supply | Qty: 30 | Fill #0

## 2017-05-16 ENCOUNTER — Other Ambulatory Visit: Payer: Self-pay | Admitting: Family Medicine

## 2017-05-29 ENCOUNTER — Telehealth: Payer: Self-pay

## 2017-05-29 NOTE — Telephone Encounter (Signed)
Pt left v/m; pt has already scheduled CPX on 06/19/17 but pt wanted to verify if needs med refills can get those until seen; last CPX 11/07/15; per DPR left v/m if needs refills prior to appt to contact pharmacy and request will be processed.

## 2017-06-12 ENCOUNTER — Other Ambulatory Visit: Payer: Self-pay | Admitting: Family Medicine

## 2017-06-12 MED FILL — FLUTICASONE PROP 50 MCG SPR: 50 | 30 days supply | Qty: 16 | Fill #0

## 2017-06-17 ENCOUNTER — Other Ambulatory Visit: Payer: Self-pay | Admitting: Family Medicine

## 2017-06-17 MED FILL — SIMVASTATIN 20 MG TABLET: 20 | 30 days supply | Qty: 30 | Fill #0

## 2017-06-19 ENCOUNTER — Ambulatory Visit (INDEPENDENT_AMBULATORY_CARE_PROVIDER_SITE_OTHER): Payer: 59 | Admitting: Family Medicine

## 2017-06-19 ENCOUNTER — Encounter: Payer: 59 | Admitting: Family Medicine

## 2017-06-19 ENCOUNTER — Other Ambulatory Visit (HOSPITAL_COMMUNITY)
Admission: RE | Admit: 2017-06-19 | Discharge: 2017-06-19 | Disposition: A | Discharge status: Home / Self Care | Payer: 59 | Source: Ambulatory Visit | Attending: Family Medicine | Admitting: Family Medicine

## 2017-06-19 ENCOUNTER — Encounter: Payer: Self-pay | Admitting: Family Medicine

## 2017-06-19 VITALS — BP 102/80 | HR 87 | Temp 98.9°F | Ht 61.5 in | Wt 166.5 lb

## 2017-06-19 DIAGNOSIS — Z113 Encounter for screening for infections with a predominantly sexual mode of transmission: Secondary | ICD-10-CM

## 2017-06-19 DIAGNOSIS — B977 Papillomavirus as the cause of diseases classified elsewhere: Secondary | ICD-10-CM | POA: Diagnosis not present

## 2017-06-19 DIAGNOSIS — E785 Hyperlipidemia, unspecified: Secondary | ICD-10-CM

## 2017-06-19 DIAGNOSIS — Z01419 Encounter for gynecological examination (general) (routine) without abnormal findings: Secondary | ICD-10-CM

## 2017-06-19 DIAGNOSIS — D500 Iron deficiency anemia secondary to blood loss (chronic): Secondary | ICD-10-CM

## 2017-06-19 DIAGNOSIS — Z124 Encounter for screening for malignant neoplasm of cervix: Secondary | ICD-10-CM | POA: Insufficient documentation

## 2017-06-19 LAB — CBC WITH DIFFERENTIAL/PLATELET
BASOS ABS: 0.1 10*3/uL (ref 0.0–0.1)
Basophils Relative: 1.1 % (ref 0.0–3.0)
EOS PCT: 1.4 % (ref 0.0–5.0)
Eosinophils Absolute: 0.1 10*3/uL (ref 0.0–0.7)
HCT: 41.4 % (ref 36.0–46.0)
HEMOGLOBIN: 13.5 g/dL (ref 12.0–15.0)
LYMPHS ABS: 1.8 10*3/uL (ref 0.7–4.0)
Lymphocytes Relative: 20.8 % (ref 12.0–46.0)
MCHC: 32.6 g/dL (ref 30.0–36.0)
MCV: 86.8 fl (ref 78.0–100.0)
MONO ABS: 0.6 10*3/uL (ref 0.1–1.0)
Monocytes Relative: 6.9 % (ref 3.0–12.0)
NEUTROS PCT: 69.8 % (ref 43.0–77.0)
Neutro Abs: 6 10*3/uL (ref 1.4–7.7)
Platelets: 339 10*3/uL (ref 150.0–400.0)
RBC: 4.76 Mil/uL (ref 3.87–5.11)
RDW: 13.6 % (ref 11.5–15.5)
WBC: 8.6 10*3/uL (ref 4.0–10.5)

## 2017-06-19 LAB — COMPREHENSIVE METABOLIC PANEL
ALBUMIN: 4.3 g/dL (ref 3.5–5.2)
ALK PHOS: 44 U/L (ref 39–117)
ALT: 13 U/L (ref 0–35)
AST: 12 U/L (ref 0–37)
BILIRUBIN TOTAL: 0.6 mg/dL (ref 0.2–1.2)
BUN: 12 mg/dL (ref 6–23)
CO2: 25 mEq/L (ref 19–32)
CREATININE: 0.9 mg/dL (ref 0.40–1.20)
Calcium: 9.6 mg/dL (ref 8.4–10.5)
Chloride: 106 mEq/L (ref 96–112)
GFR: 91.12 mL/min (ref >60.00)
GLUCOSE: 95 mg/dL (ref 70–99)
Potassium: 3.6 mEq/L (ref 3.5–5.1)
SODIUM: 139 meq/L (ref 135–145)
TOTAL PROTEIN: 7 g/dL (ref 6.0–8.3)

## 2017-06-19 LAB — IBC PANEL
Iron: 61 ug/dL (ref 42–145)
Saturation Ratios: 16.8 % — ABNORMAL LOW (ref 20.0–50.0)
Transferrin: 259 mg/dL (ref 212.0–360.0)

## 2017-06-19 LAB — LIPID PANEL
CHOLESTEROL: 202 mg/dL — AB (ref 0–200)
HDL: 59.2 mg/dL (ref >39.00)
LDL Cholesterol: 123 mg/dL — ABNORMAL HIGH (ref 0–99)
NONHDL: 142.8
Total CHOL/HDL Ratio: 3
Triglycerides: 101 mg/dL (ref 0.0–149.0)
VLDL: 20.2 mg/dL (ref 0.0–40.0)

## 2017-06-19 LAB — TSH: TSH: 0.55 u[IU]/mL (ref 0.35–4.50)

## 2017-06-19 LAB — FERRITIN: FERRITIN: 52.3 ng/mL (ref 10.0–291.0)

## 2017-06-19 NOTE — Addendum Note (Signed)
Addended by: Damita LackLORING, DONNA S on: 06/19/2017 12:26 PM   Modules accepted: Orders

## 2017-06-19 NOTE — Assessment & Plan Note (Signed)
Continue current dose of zocor. Labs today. 

## 2017-06-19 NOTE — Assessment & Plan Note (Signed)
Reviewed preventive care protocols, scheduled due services, and updated immunizations Discussed nutrition, exercise, diet, and healthy lifestyle.  Orders Placed This Encounter  Procedures  . CBC with Differential/Platelet  . Comprehensive metabolic panel  . Lipid panel  . TSH  . HIV antibody (with reflex)  . RPR   Pap smear today.

## 2017-06-19 NOTE — Addendum Note (Signed)
Addended by: Alvina ChouWALSH, TERRI J on: 06/19/2017 12:34 PM   Modules accepted: Orders

## 2017-06-19 NOTE — Patient Instructions (Signed)
Great to see you! Happy [birth year]! 

## 2017-06-19 NOTE — Progress Notes (Signed)
36 yo here for CPX and follow up of chronic medical conditions.     Last pap smear done by me on 11/07/15- neg cytology, pos HPV. Advised repeat pap smear in 1 year.   HLD- on zocor 20 mg daily.Due for labs. No myalgias.    Lab Results  Component Value Date   CHOL 205 (H) 11/07/2015   HDL 48.40 11/07/2015   LDLCALC 134 (H) 11/07/2015   LDLDIRECT 183.8 01/14/2013   TRIG 112.0 11/07/2015   CHOLHDL 4 11/07/2015    Patient Active Problem List   Diagnosis Date Noted  . 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 Iron deficiency anemia due to chronic blood loss 12/22/2015  . Menorrhagia with regular cycle 12/22/2015  . Well woman exam with routine gynecological exam 11/07/2015  . Vaginal irritation 11/07/2015  . Pain in lateral portion of right knee 06/30/2015  . Blood pressure elevated without history of HTN 06/30/2015  . HLD (hyperlipidemia) 08/18/2009   Past Medical History:  Diagnosis Date  . Anemia   . Hyperlipidemia    Past Surgical History:  Procedure Laterality Date  . WISDOM TOOTH EXTRACTION     Social History  Substance Use Topics  . Smoking status: Never Smoker  . Smokeless tobacco: Never Used  . Alcohol use 0.0 oz/week     Comment: occasional   Family History  Problem Relation Age of Onset  . Diabetes Mother   . Hypertension Mother   . Hyperlipidemia Father   . Hypertension Father    No Known Allergies Current Outpatient Prescriptions on File Prior to Visit  Medication Sig Dispense Refill  . b complex vitamins capsule Take 1 capsule by mouth daily. 30 capsule 3  . fluticasone (FLONASE) 50 MCG/ACT nasal spray INSTILL 2 SPRAYS INTO BOTH NOSTRILS DAILY. 16 g 0  . hydrochlorothiazide (MICROZIDE) 12.5 MG capsule 1 tab by mouth daily as needed for swelling 20 capsule 0  . simvastatin (ZOCOR) 20 MG tablet TAKE 1 TABLET BY MOUTH EVERY EVENING. PATIENT NEEDS ANNUAL PHYSICAL AND LAB WORK 30 tablet 0   No current facility-administered medications on  file prior to visit.    The PMH, PSH, Social History, Family History, Medications, and allergies [facility], and have been updated if relevant.  Review of Systems  Constitutional: Negative.   HENT: Negative.   Eyes: Negative.   Respiratory: Negative.   Cardiovascular: Negative.   Gastrointestinal: Negative.   Endocrine: Negative.   Genitourinary: Negative for decreased urine volume, pelvic pain, urgency, vaginal bleeding, vaginal discharge and vaginal pain.  Musculoskeletal: Negative.   Skin: Negative.   Allergic/Immunologic: Negative.   Neurological: Negative.   Hematological: Negative.   Psychiatric/Behavioral: Negative.   All other systems reviewed and are negative.    Physical exam: BP 102/80   Pulse 87   Temp 98.9 F (37.2 C) (Oral)   Ht 5' 1.5" (1.562 m)   Wt 166 lb 8 oz (75.5 kg)   LMP 05/19/2017   BMI 30.95 kg/m     General:  Well-developed,well-nourished,in no acute distress; alert,appropriate and cooperative throughout examination Head:  normocephalic and atraumatic.   Eyes:  vision grossly intact, PERRL Ears:  R ear normal and L ear normal externally, TMs clear bilaterally Nose:  no external deformity.   Mouth:  good dentition.   Neck:  No deformities, masses, or tenderness noted. Breasts:  No mass, nodules, thickening, tenderness, bulging, retraction, inflamation, nipple discharge or skin changes noted.   Lungs:  Normal respiratory effort,  chest expands symmetrically. Lungs are clear to auscultation, no crackles or wheezes. Heart:  Normal rate and regular rhythm. S1 and S2 normal without gallop, murmur, click, rub or other extra sounds. Abdomen:  Bowel sounds positive,abdomen soft and non-tender without masses, organomegaly or hernias noted. Rectal:  no external abnormalities.   Genitalia:  Pelvic Exam:        External: normal female genitalia without lesions or masses        Vagina: normal without lesions or masses        Cervix: normal  without lesions or masses        Adnexa: normal bimanual exam without masses or fullness        Uterus: normal by palpation        Pap smear: performed Msk:  No deformity or scoliosis noted of thoracic or lumbar spine.   Extremities:  No clubbing, cyanosis, edema, or deformity noted with normal full range of motion of all joints.   Neurologic:  alert & oriented X3 and gait normal.   Skin:  Intact without suspicious lesions or rashes Cervical Nodes:  No lymphadenopathy noted Axillary Nodes:  No palpable lymphadenopathy Psych:  Cognition and judgment appear intact. Alert and cooperative with normal attention span and concentration. No apparent delusions, illusions, hallucinations

## 2017-06-20 LAB — RPR: RPR Ser Ql: NONREACTIVE

## 2017-06-20 LAB — HIV ANTIBODY (ROUTINE TESTING W REFLEX): HIV: NONREACTIVE

## 2017-06-23 LAB — CYTOLOGY - PAP
CHLAMYDIA, DNA PROBE: NEGATIVE
DIAGNOSIS: NEGATIVE
HPV: NOT DETECTED
NEISSERIA GONORRHEA: NEGATIVE
TRICH (WINDOWPATH): NEGATIVE

## 2017-06-24 LAB — CERVICOVAGINAL ANCILLARY ONLY: Herpes: NEGATIVE

## 2017-07-21 ENCOUNTER — Other Ambulatory Visit: Payer: Self-pay | Admitting: Family Medicine

## 2017-07-21 MED FILL — SIMVASTATIN 20 MG TABLET: 20 | 90 days supply | Qty: 90 | Fill #0

## 2017-10-02 ENCOUNTER — Other Ambulatory Visit: Payer: Self-pay | Admitting: Family Medicine

## 2017-10-14 MED FILL — FLUTICASONE PROP 50 MCG SPR: 50 | 30 days supply | Qty: 16 | Fill #0

## 2017-10-27 MED FILL — SIMVASTATIN 20 MG TABLET: 20 | 90 days supply | Qty: 90 | Fill #1

## 2017-12-02 ENCOUNTER — Encounter: Payer: Self-pay | Admitting: Family Medicine

## 2018-01-28 MED FILL — FLUTICASONE PROP 50 MCG SPR: 50 | 30 days supply | Qty: 16 | Fill #1

## 2018-01-28 MED FILL — SIMVASTATIN 20 MG TABS: 20 | 90 days supply | Qty: 90 | Fill #2

## 2018-05-26 MED FILL — SIMVASTATIN 20 MG TABLET: 20 | 90 days supply | Qty: 90 | Fill #3

## 2018-05-26 MED FILL — FLUTICASONE PROP 50 MCG SPR: 50 | 30 days supply | Qty: 16 | Fill #2

## 2018-06-25 ENCOUNTER — Ambulatory Visit: Payer: 59 | Admitting: Family Medicine

## 2018-06-29 ENCOUNTER — Other Ambulatory Visit: Payer: Self-pay | Admitting: Family Medicine

## 2018-06-29 ENCOUNTER — Ambulatory Visit: Payer: 59 | Admitting: Family Medicine

## 2018-06-29 ENCOUNTER — Other Ambulatory Visit (INDEPENDENT_AMBULATORY_CARE_PROVIDER_SITE_OTHER): Payer: 59

## 2018-06-29 ENCOUNTER — Encounter: Payer: Self-pay | Admitting: Family Medicine

## 2018-06-29 VITALS — BP 118/78 | HR 79 | Temp 99.2°F | Ht 62.0 in | Wt 169.4 lb

## 2018-06-29 DIAGNOSIS — R030 Elevated blood-pressure reading, without diagnosis of hypertension: Secondary | ICD-10-CM

## 2018-06-29 DIAGNOSIS — E785 Hyperlipidemia, unspecified: Secondary | ICD-10-CM | POA: Diagnosis not present

## 2018-06-29 DIAGNOSIS — D500 Iron deficiency anemia secondary to blood loss (chronic): Secondary | ICD-10-CM

## 2018-06-29 DIAGNOSIS — M533 Sacrococcygeal disorders, not elsewhere classified: Secondary | ICD-10-CM

## 2018-06-29 LAB — COMPREHENSIVE METABOLIC PANEL
ALK PHOS: 47 U/L (ref 39–117)
ALT: 12 U/L (ref 0–35)
AST: 11 U/L (ref 0–37)
Albumin: 4.4 g/dL (ref 3.5–5.2)
BILIRUBIN TOTAL: 0.4 mg/dL (ref 0.2–1.2)
BUN: 11 mg/dL (ref 6–23)
CALCIUM: 9.7 mg/dL (ref 8.4–10.5)
CO2: 26 meq/L (ref 19–32)
Chloride: 105 mEq/L (ref 96–112)
Creatinine, Ser: 0.78 mg/dL (ref 0.40–1.20)
GFR: 106.87 mL/min (ref >60.00)
Glucose, Bld: 84 mg/dL (ref 70–99)
POTASSIUM: 3.9 meq/L (ref 3.5–5.1)
Sodium: 138 mEq/L (ref 135–145)
Total Protein: 7.1 g/dL (ref 6.0–8.3)

## 2018-06-29 LAB — CBC WITH DIFFERENTIAL/PLATELET
BASOS ABS: 0.1 10*3/uL (ref 0.0–0.1)
Basophils Relative: 1.3 % (ref 0.0–3.0)
Eosinophils Absolute: 0.1 10*3/uL (ref 0.0–0.7)
Eosinophils Relative: 0.7 % (ref 0.0–5.0)
HCT: 40.7 % (ref 36.0–46.0)
Hemoglobin: 13.5 g/dL (ref 12.0–15.0)
LYMPHS ABS: 2 10*3/uL (ref 0.7–4.0)
Lymphocytes Relative: 25.9 % (ref 12.0–46.0)
MCHC: 33.2 g/dL (ref 30.0–36.0)
MCV: 84.6 fl (ref 78.0–100.0)
MONO ABS: 0.4 10*3/uL (ref 0.1–1.0)
MONOS PCT: 5.7 % (ref 3.0–12.0)
NEUTROS ABS: 5.1 10*3/uL (ref 1.4–7.7)
NEUTROS PCT: 66.4 % (ref 43.0–77.0)
Platelets: 315 10*3/uL (ref 150.0–400.0)
RBC: 4.81 Mil/uL (ref 3.87–5.11)
RDW: 13.6 % (ref 11.5–15.5)
WBC: 7.6 10*3/uL (ref 4.0–10.5)

## 2018-06-29 LAB — LIPID PANEL
CHOL/HDL RATIO: 5
Cholesterol: 235 mg/dL — ABNORMAL HIGH (ref 0–200)
HDL: 43.7 mg/dL (ref >39.00)
LDL Cholesterol: 163 mg/dL — ABNORMAL HIGH (ref 0–99)
NONHDL: 191.52
TRIGLYCERIDES: 145 mg/dL (ref 0.0–149.0)
VLDL: 29 mg/dL (ref 0.0–40.0)

## 2018-06-29 LAB — FERRITIN: Ferritin: 48.7 ng/mL (ref 10.0–291.0)

## 2018-06-29 LAB — TSH: TSH: 1.37 u[IU]/mL (ref 0.35–4.50)

## 2018-06-29 MED ORDER — PREDNISONE 10 MG PO TABS: ORAL_TABLET | ORAL | 0 refills | Status: DC

## 2018-06-29 MED FILL — predniSONE 10 MG TABS: 10 | 7 days supply | Qty: 15 | Fill #0

## 2018-06-29 NOTE — Assessment & Plan Note (Signed)
New- no red flag signs or symptoms. Treat with oral steroid (prednisone) burst. Given exercises from sports me advisor- discussed these and support care.  Handout given. Call or return to clinic prn if these symptoms worsen or fail to improve as anticipated. Will refer to PT and or Dr. Jordan LikesSchmitz if symptoms fail to improve. The patient indicates understanding of these issues and agrees with the plan.

## 2018-06-29 NOTE — Progress Notes (Signed)
Subjective:   Patient ID: Tina Cooper, female    DOB: 1981/01/02, 37 y.o.   MRN: [id number]  Tina Cooper is a pleasant 37 y.o. year old female who presents to clinic today with Hip Pain (Patient is here today C/O left hip pain.  The pain started about 736-months-ago and has worsened.  If she turns a certain way the pain worsens. Pain radiates down to the back of left mid-thigh.  Denies any numbness or tingling.  Sometimes she has back spasms on the left side close to the hip.  She declines vaccines today.)  on 06/29/2018  HPI:  Left lower back/buttock pain that radiates to back of upper thigh.  Started approximately 6 months ago.  No known injury but she is an Charity fundraiserN and lifts, bends, turns, sits and those activities all seem to aggravate this pain.  She says it feels like "nerve" and not muscle pain. Has tried a muscle relaxant for it (?methocarbimol) but did not get any improvement of symptoms.  Has not tried any other rxs or treatment for it.  No LE weakness.  Pain never radiates down past her thigh.  No numbness.  Current Outpatient Medications on File Prior to Visit  Medication Sig Dispense Refill  . b complex vitamins capsule Take 1 capsule by mouth daily. 30 capsule 3  . docusate sodium (COLACE) 100 MG capsule Take 100 mg by mouth 2 (two) times daily.    . ferrous sulfate 325 (65 FE) MG tablet Take 325 mg by mouth daily with breakfast.    . fluticasone (FLONASE) 50 MCG/ACT nasal spray INSTILL 2 SPRAYS INTO BOTH NOSTRILS DAILY. 16 g 5  . simvastatin (ZOCOR) 20 MG tablet Take 1 tablet (20 mg total) by mouth every evening. 90 tablet 3  . hydrochlorothiazide (MICROZIDE) 12.5 MG capsule 1 tab by mouth daily as needed for swelling (Patient not taking: Reported on 06/29/2018) 20 capsule 0   No current facility-administered medications on file prior to visit.     No Known Allergies  Past Medical History:  Diagnosis Date  . Anemia   . Hyperlipidemia     Past Surgical History:  Procedure  Laterality Date  . WISDOM TOOTH EXTRACTION      Family History  Problem Relation Age of Onset  . Diabetes Mother   . Hypertension Mother   . Hyperlipidemia Father   . Hypertension Father     Social History   Socioeconomic History  . Marital status: Single    Spouse name: Not on file  . Number of children: Not on file  . Years of education: Not on file  . Highest education level: Not on file  Occupational History  . Occupation: Teacher, adult educationN    Employer: [employer]  Social Needs  . Financial resource strain: Not on file  . Food insecurity:    Worry: Not on file    Inability: Not on file  . Transportation needs:    Medical: Not on file    Non-medical: Not on file  Tobacco Use  . Smoking status: Never Smoker  . Smokeless tobacco: Never Used  Substance and Sexual Activity  . Alcohol use: Yes    Alcohol/week: 0.0 standard drinks    Comment: occasional  . Drug use: No  . Sexual activity: Yes    Birth control/protection: None  Lifestyle  . Physical activity:    Days per week: Not on file    Minutes per session: Not on file  . Stress: Not  on file  Relationships  . Social connections:    Talks on phone: Not on file    Gets together: Not on file    Attends religious service: Not on file    Active member of club or organization: Not on file    Attends meetings of clubs or organizations: Not on file    Relationship status: Not on file  . Intimate partner violence:    Fear of current or ex partner: Not on file    Emotionally abused: Not on file    Physically abused: Not on file    Forced sexual activity: Not on file  Other Topics Concern  . Not on file  Social History Narrative  . Not on file   The PMH, PSH, Social History, Family History, Medications, and allergies have been reviewed in [facility], and have been updated if relevant.   Review of Systems  Constitutional: Negative.   Genitourinary: Negative.   Musculoskeletal: Positive for arthralgias and back pain. Negative for  gait problem, joint swelling, myalgias, neck pain and neck stiffness.  Skin: Negative.   Neurological: Negative for dizziness, seizures, syncope, weakness, numbness and headaches.  All other systems reviewed and are negative.      Objective:    BP 118/78 (BP Location: Left Arm, Patient Position: Sitting, Cuff Size: Normal)   Pulse 79   Temp 99.2 F (37.3 C) (Oral)   Ht 5\' 2"  (1.575 m)   Wt 169 lb 6.4 oz (76.8 kg)   LMP 06/08/2018   SpO2 99%   BMI 30.98 kg/m    Physical Exam  Constitutional: She is oriented to person, place, and time. She appears well-developed and well-nourished. No distress.  HENT:  Head: Normocephalic and atraumatic.  Eyes: EOM are normal.  Neck: Normal range of motion.  Cardiovascular: Normal rate.  Pulmonary/Chest: Effort normal.  Musculoskeletal:       Left hip: She exhibits normal range of motion, normal strength, no tenderness, no bony tenderness, no swelling, no crepitus and no deformity.  No pain with hip extension or flexion No TTP over left trochanteric bursa TTP over left SI joint Pain is worse when she twists or bends to the right  Neurological: She is alert and oriented to person, place, and time. She displays normal reflexes. No cranial nerve deficit. Coordination normal.  Skin: Skin is warm and dry. She is not diaphoretic.  Psychiatric: She has a normal mood and affect. Her behavior is normal. Judgment and thought content normal.  Nursing note and vitals reviewed.         Assessment & Plan:   SI (sacroiliac) pain No follow-ups on file.

## 2018-06-29 NOTE — Patient Instructions (Signed)
Please schedule Physical with PAP in October or November :)

## 2018-06-30 ENCOUNTER — Telehealth: Payer: Self-pay

## 2018-06-30 DIAGNOSIS — E785 Hyperlipidemia, unspecified: Secondary | ICD-10-CM

## 2018-06-30 MED ORDER — SIMVASTATIN 40 MG PO TABS: 40.0000 mg | ORAL_TABLET | Freq: Every evening | ORAL | 3 refills | Status: DC

## 2018-06-30 MED FILL — SIMVASTATIN 40 MG TABS: 40 | 30 days supply | Qty: 30 | Fill #0

## 2018-06-30 NOTE — Telephone Encounter (Signed)
PEC-I LMOVM that most of her labs look great with the exception of her Cholesterol and to please RTC to advise if she has been taking her cholesterol medication or not and we can give her any details that she would like to know about her labs at that time as well/thx dmf

## 2018-06-30 NOTE — Addendum Note (Signed)
Addended by: Dianne DunARON, TALIA M on: 06/30/2018 01:14 PM   Modules accepted: Orders

## 2018-06-30 NOTE — Telephone Encounter (Signed)
TA-Pt returned call to advise that she has been taking the Simvastatin/plz advise/thx dmf

## 2018-06-30 NOTE — Telephone Encounter (Signed)
Since she is taking it, let's increase her dose to simvastatin 40 mg daily.  Return in 8 weeks for repeat lipid panel and CMET.

## 2018-06-30 NOTE — Telephone Encounter (Signed)
-----   Message from Dianne Dunalia M Aron, MD sent at 06/29/2018  6:38 PM EDT ----- Please call pt- Please let pt know that her cbc, ferritin, thyroid function, liver function, kidney function and electrolytes look excellent. Her cholesterol is much higher however. Did she stop taking her cholesterol medication?

## 2018-07-01 NOTE — Addendum Note (Signed)
Addended by: Reuben Likes on: 07/01/2018 10:57 AM   Modules accepted: Orders

## 2018-07-01 NOTE — Telephone Encounter (Signed)
Future labs created and pt aware that medication dosage increased. Pt aware she needs to return to lab in 8 weeks

## 2018-07-13 ENCOUNTER — Encounter: Payer: Self-pay | Admitting: Family Medicine

## 2018-07-14 ENCOUNTER — Other Ambulatory Visit: Payer: Self-pay | Admitting: Family Medicine

## 2018-07-14 MED ORDER — PREDNISONE 10 MG PO TABS: ORAL_TABLET | ORAL | 0 refills | Status: DC

## 2018-07-14 MED FILL — predniSONE 10 MG TABS: 10 | 8 days supply | Qty: 15 | Fill #0

## 2018-09-07 MED FILL — FLUTICASONE PROP 50 MCG SPR: 50 | 30 days supply | Qty: 16 | Fill #3

## 2018-09-07 MED FILL — SIMVASTATIN 40 MG TABS: 40 | 30 days supply | Qty: 30 | Fill #1

## 2018-10-12 MED FILL — SIMVASTATIN 40 MG TABLET: 40 | 30 days supply | Qty: 30 | Fill #2

## 2018-10-28 ENCOUNTER — Telehealth: Payer: Self-pay

## 2018-10-28 NOTE — Telephone Encounter (Signed)
Copied from CRM [id number]. Topic: Appointment Scheduling - Scheduling Inquiry for Clinic >> Oct 28, 2018  2:39 PM Jilda Roche wrote: Reason for CRM: Patient states she needs an order for follow up lab work since taking a new medication please advise  Best call back is [phone]

## 2018-10-29 ENCOUNTER — Other Ambulatory Visit (INDEPENDENT_AMBULATORY_CARE_PROVIDER_SITE_OTHER): Payer: 59

## 2018-10-29 DIAGNOSIS — E785 Hyperlipidemia, unspecified: Secondary | ICD-10-CM | POA: Diagnosis not present

## 2018-10-29 LAB — LIPID PANEL
Cholesterol: 196 mg/dL (ref 0–200)
HDL: 49.4 mg/dL (ref >39.00)
LDL Cholesterol: 126 mg/dL — ABNORMAL HIGH (ref 0–99)
NonHDL: 146.61
Total CHOL/HDL Ratio: 4
Triglycerides: 105 mg/dL (ref 0.0–149.0)
VLDL: 21 mg/dL (ref 0.0–40.0)

## 2018-10-29 LAB — COMPREHENSIVE METABOLIC PANEL
ALT: 13 U/L (ref 0–35)
AST: 13 U/L (ref 0–37)
Albumin: 4.4 g/dL (ref 3.5–5.2)
Alkaline Phosphatase: 43 U/L (ref 39–117)
BUN: 8 mg/dL (ref 6–23)
CO2: 28 mEq/L (ref 19–32)
Calcium: 9.5 mg/dL (ref 8.4–10.5)
Chloride: 103 mEq/L (ref 96–112)
Creatinine, Ser: 0.78 mg/dL (ref 0.40–1.20)
GFR: 100.37 mL/min (ref >60.00)
Glucose, Bld: 90 mg/dL (ref 70–99)
Potassium: 3.9 mEq/L (ref 3.5–5.1)
Sodium: 137 mEq/L (ref 135–145)
Total Bilirubin: 0.3 mg/dL (ref 0.2–1.2)
Total Protein: 6.7 g/dL (ref 6.0–8.3)

## 2018-11-10 MED FILL — SIMVASTATIN 40 MG TABLET: 40 | 30 days supply | Qty: 30 | Fill #3

## 2018-12-10 ENCOUNTER — Other Ambulatory Visit: Payer: Self-pay | Admitting: Family Medicine

## 2018-12-11 MED FILL — SIMVASTATIN 40 MG TABLET: 40 | 90 days supply | Qty: 90 | Fill #0

## 2018-12-17 ENCOUNTER — Other Ambulatory Visit: Payer: Self-pay | Admitting: Family Medicine

## 2018-12-18 MED FILL — FLUTICASONE PROP 50 MCG SPR: 50 | 30 days supply | Qty: 16 | Fill #0

## 2019-02-02 MED FILL — FLUTICASONE PROP 50 MCG SPR: 50 | 30 days supply | Qty: 16 | Fill #1

## 2019-02-20 ENCOUNTER — Encounter: Payer: Self-pay | Admitting: Family Medicine

## 2019-02-22 ENCOUNTER — Ambulatory Visit (INDEPENDENT_AMBULATORY_CARE_PROVIDER_SITE_OTHER): Payer: 59 | Admitting: Family Medicine

## 2019-02-22 ENCOUNTER — Encounter: Payer: Self-pay | Admitting: Family Medicine

## 2019-02-22 VITALS — Wt 170.0 lb

## 2019-02-22 DIAGNOSIS — N926 Irregular menstruation, unspecified: Secondary | ICD-10-CM | POA: Diagnosis not present

## 2019-02-22 NOTE — Progress Notes (Signed)
Virtual Visit via Video   Due to the COVID-19 pandemic, this visit was completed with telemedicine (audio/video) technology to reduce patient and provider exposure as well as to preserve personal protective equipment.   I connected with Tina Cooper by a video enabled telemedicine application and verified that I am speaking with the correct person using two identifiers. Location patient: Home Location provider: [facility], Office Persons participating in the virtual visit: Avelyn Lauretta Grill, MD   I discussed the limitations of evaluation and management by telemedicine and the availability of in person appointments. The patient expressed understanding and agreed to proceed.  Care Team   Patient Care Team: Dianne Dun, MD as PCP - General  Subjective:   HPI:   Irregular menstrual periods- . "I've been having some abnormal vaginal bleeding and I'm concerned about it. I had a period a week earlier than normal that started on May 1st. When it ended I continued to have spotting with some light blood. This has now turned into heavier bleeding and I'm not sure what's going on."  She is currently bleeding.  Not having any pain.  No fevers.  Was having to use up to 4 super plus tampons per day and now down to 2-3.  Not on any birth control.   She is sexually active, having unprotected sex with one partner.  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  Review of Systems  Constitutional: Negative.   HENT: Negative.   Eyes: Negative.   Respiratory: Negative.   Cardiovascular: Negative.   Gastrointestinal: Negative.   Genitourinary:       + irregular period  Skin: Negative.   Neurological: Negative.   Endo/Heme/Allergies: Negative.   Psychiatric/Behavioral: Negative.   All other systems reviewed and are negative.    Patient Active Problem List   Diagnosis Date Noted  . Irregular periods/menstrual cycles 02/22/2019  . SI (sacroiliac) pain  06/29/2018  . 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 Iron deficiency anemia due to chronic blood loss 12/22/2015  . Pain in lateral portion of right knee 06/30/2015  . HLD (hyperlipidemia) 08/18/2009    Social History   Tobacco Use  . Smoking status: Never Smoker  . Smokeless tobacco: Never Used  Substance Use Topics  . Alcohol use: Yes    Alcohol/week: 0.0 standard drinks    Comment: occasional    Current Outpatient Medications:  .  b complex vitamins capsule, Take 1 capsule by mouth daily., Disp: 30 capsule, Rfl: 3 .  docusate sodium (COLACE) 100 MG capsule, Take 100 mg by mouth 2 (two) times daily., Disp: , Rfl:  .  ferrous sulfate 325 (65 FE) MG tablet, Take 325 mg by mouth daily with breakfast., Disp: , Rfl:  .  fluticasone (FLONASE) 50 MCG/ACT nasal spray, INSTILL 2 SPRAYS INTO BOTH NOSTRILS DAILY., Disp: 16 g, Rfl: 5 .  simvastatin (ZOCOR) 40 MG tablet, TAKE 1 TABLET BY MOUTH EVERY EVENING, Disp: 90 tablet, Rfl: 3 .  hydrochlorothiazide (MICROZIDE) 12.5 MG capsule, 1 tab by mouth daily as needed for swelling (Patient not taking: Reported on 06/29/2018), Disp: 20 capsule, Rfl: 0  No Known Allergies  Objective:  Wt 170 lb (77.1 kg)   LMP 02/05/2019   BMI 31.09 kg/m   VITALS: Per patient if applicable, see vitals. GENERAL: Alert, appears well and in no acute distress. HEENT: Atraumatic, conjunctiva clear, no obvious abnormalities on inspection of external nose and ears. NECK: Normal  movements of the head and neck. CARDIOPULMONARY: No increased WOB. Speaking in clear sentences. I:E ratio WNL.  MS: Moves all visible extremities without noticeable abnormality. PSYCH: Pleasant and cooperative, well-groomed. Speech normal rate and rhythm. Affect is appropriate. Insight and judgement are appropriate. Attention is focused, linear, and appropriate.  NEURO: CN grossly intact. Oriented as arrived to appointment on time with no prompting. Moves both UE equally.  SKIN: No  obvious lesions, wounds, erythema, or cyanosis noted on face or hands.  Depression screen [facility] 2/9 06/29/2018 06/19/2017  Decreased Interest 0 0  Down, Depressed, Hopeless 0 0  PHQ - 2 Score 0 0    Assessment and Plan:   Leasia was seen today for break through bleeding.  Diagnoses and all orders for this visit:  Irregular periods/menstrual cycles    . COVID-19 Education: The signs and symptoms of COVID-19 were discussed with the patient and how to seek care for testing if needed. The importance of social distancing was discussed today. . Reviewed expectations re: course of current medical issues. . Discussed self-management of symptoms. . Outlined signs and symptoms indicating need for more acute intervention. . Patient verbalized understanding and all questions were answered. Marland Kitchen. Health Maintenance issues including appropriate healthy diet, exercise, and smoking avoidance were discussed with patient. . See orders for this visit as documented in the electronic medical record.  Ruthe Mannanalia Marselino Slayton, MD  Records requested if needed. Time spent: 25 minutes, of which >50% was spent in obtaining information about her symptoms, reviewing her previous labs, evaluations, and treatments, counseling her about her condition (please see the discussed topics above), and developing a plan to further investigate it; she had a number of questions which I addressed.

## 2019-02-22 NOTE — Assessment & Plan Note (Signed)
>  25 minutes spent in face to face time with patient, >50% spent in counselling or coordination of care discussing this one episode of an irregular period- discussed differential diagnosis- early SAB, stress, etc.  She has not red flag symptoms concerning for retained products of conception but we did discuss red flag symptoms, including fever, abdominal pain, periods getting heavier instead of lighter, etc. Also discussed that her period may not be normal next month or even two months. Call or send my chart message prn if these symptoms worsen or fail to improve as anticipated. The patient indicates understanding of these issues and agrees with the plan.

## 2019-03-03 ENCOUNTER — Encounter: Payer: Self-pay | Admitting: Family Medicine

## 2019-03-08 ENCOUNTER — Encounter: Payer: 59 | Admitting: Family Medicine

## 2019-03-11 ENCOUNTER — Other Ambulatory Visit: Payer: Self-pay | Admitting: Family Medicine

## 2019-03-11 MED ORDER — FLUCONAZOLE 150 MG PO TABS: 150.0000 mg | ORAL_TABLET | Freq: Once | ORAL | 0 refills | Status: AC

## 2019-03-11 NOTE — Progress Notes (Signed)
Yes of course.  eRx sent.

## 2019-04-12 MED FILL — SIMVASTATIN 40 MG TABLET: 40 | 90 days supply | Qty: 90 | Fill #1

## 2019-04-20 NOTE — Progress Notes (Signed)
Subjective:   Patient ID: Tina Cooper, female    DOB: 1981/07/30, 38 y.o.   MRN: [id number]  Tina Cooper is a pleasant 38 y.o. year old female who presents to clinic today with Annual Exam (Pt screened at vehicle. She is here today for a CPE with PAP.  She is currently fasting. Lipid Panel and CMP drawn in January.)  on 04/21/2019  HPI:  Health Maintenance  Topic Date Due  . PAP SMEAR-Modifier  06/19/2018  . TETANUS/TDAP  06/30/2019 (Originally 08/29/2015)  . INFLUENZA VACCINE  05/08/2019  . HIV Screening  Completed    Last pap smear done by me on 06/25/19- it was normal- neg cytology, HPV and Stds.    On 11/07/15- Pap smear showed neg cytology, pos HPV. Advised repeat pap smear in 1 year.  She does want STD testing today.  HLD- currently taking zocor 40 mg daily. No myalgias.    Lab Results  Component Value Date   CHOL 196 10/29/2018   HDL 49.40 10/29/2018   LDLCALC 126 (H) 10/29/2018   LDLDIRECT 183.8 01/14/2013   TRIG 105.0 10/29/2018   CHOLHDL 4 10/29/2018   Lab Results  Component Value Date   ALT 13 10/29/2018   AST 13 10/29/2018   ALKPHOS 43 10/29/2018   BILITOT 0.3 10/29/2018   Iron deficiency anemia- has been taking iron daily.  Lab Results  Component Value Date   WBC 7.6 06/29/2018   HGB 13.5 06/29/2018   HCT 40.7 06/29/2018   MCV 84.6 06/29/2018   PLT 315.0 06/29/2018   Lab Results  Component Value Date   FERRITIN 48.7 06/29/2018   Current Outpatient Medications on File Prior to Visit  Medication Sig Dispense Refill  . b complex vitamins capsule Take 1 capsule by mouth daily. 30 capsule 3  . docusate sodium (COLACE) 100 MG capsule Take 100 mg by mouth 2 (two) times daily.    . ferrous sulfate 325 (65 FE) MG tablet Take 325 mg by mouth daily with breakfast.    . fluticasone (FLONASE) 50 MCG/ACT nasal spray INSTILL 2 SPRAYS INTO BOTH NOSTRILS DAILY. 16 g 5  . simvastatin (ZOCOR) 40 MG tablet TAKE 1 TABLET BY MOUTH EVERY EVENING 90 tablet 3   No  current facility-administered medications on file prior to visit.     No Known Allergies  Past Medical History:  Diagnosis Date  . Anemia   . Hyperlipidemia     Past Surgical History:  Procedure Laterality Date  . WISDOM TOOTH EXTRACTION      Family History  Problem Relation Age of Onset  . Diabetes Mother   . Hypertension Mother   . Hyperlipidemia Father   . Hypertension Father     Social History   Socioeconomic History  . Marital status: Single    Spouse name: Not on file  . Number of children: Not on file  . Years of education: Not on file  . Highest education level: Not on file  Occupational History  . Occupation: Programmer, multimedia: [employer]  . Financial resource strain: Not on file  . Food insecurity    Worry: Not on file    Inability: Not on file  . Transportation needs    Medical: Not on file    Non-medical: Not on file  Tobacco Use  . Smoking status: Never Smoker  . Smokeless tobacco: Never Used  Substance and Sexual Activity  . Alcohol use: Yes  Alcohol/week: 0.0 standard drinks    Comment: occasional  . Drug use: No  . Sexual activity: Yes    Birth control/protection: None  Lifestyle  . Physical activity    Days per week: Not on file    Minutes per session: Not on file  . Stress: Not on file  Relationships  . Social Musicianconnections    Talks on phone: Not on file    Gets together: Not on file    Attends religious service: Not on file    Active member of club or organization: Not on file    Attends meetings of clubs or organizations: Not on file    Relationship status: Not on file  . Intimate partner violence    Fear of current or ex partner: Not on file    Emotionally abused: Not on file    Physically abused: Not on file    Forced sexual activity: Not on file  Other Topics Concern  . Not on file  Social History Narrative  . Not on file   The PMH, PSH, Social History, Family History, Medications, and allergies have been  reviewed [facility], and have been updated if relevant.   Review of Systems  Constitutional: Negative.   HENT: Negative.   Eyes: Negative.   Respiratory: Negative.   Cardiovascular: Negative.   Gastrointestinal: Negative.   Endocrine: Negative.   Genitourinary: Negative.   Musculoskeletal: Negative.   Skin: Negative.   Neurological: Negative.   Hematological: Negative.   Psychiatric/Behavioral: Negative.   All other systems reviewed and are negative.      Objective:    BP 138/86 (BP Location: Left Arm, Patient Position: Sitting, Cuff Size: Normal)   Pulse 82   Temp 98.2 F (36.8 C) (Oral)   Ht 5' 1.5" (1.562 m)   Wt 164 lb 3.2 oz (74.5 kg)   LMP 04/14/2019   SpO2 98%   BMI 30.52 kg/m    Physical Exam   General:  Well-developed,well-nourished,in no acute distress; alert,appropriate and cooperative throughout examination Head:  normocephalic and atraumatic.   Eyes:  vision grossly intact, PERRL Ears:  R ear normal and L ear normal externally, TMs clear bilaterally Nose:  no external deformity.   Mouth:  good dentition.   Neck:  No deformities, masses, or tenderness noted. Breasts:  No mass, nodules, thickening, tenderness, bulging, retraction, inflamation, nipple discharge or skin changes noted.   Lungs:  Normal respiratory effort, chest expands symmetrically. Lungs are clear to auscultation, no crackles or wheezes. Heart:  Normal rate and regular rhythm. S1 and S2 normal without gallop, murmur, click, rub or other extra sounds. Abdomen:  Bowel sounds positive,abdomen soft and non-tender without masses, organomegaly or hernias noted. Rectal:  no external abnormalities.   Genitalia:  Pelvic Exam:        External: normal female genitalia without lesions or masses        Vagina: normal without lesions or masses        Cervix: normal without lesions or masses        Adnexa: normal bimanual exam without masses or fullness        Uterus: normal by palpation        Pap  smear: performed Msk:  No deformity or scoliosis noted of thoracic or lumbar spine.   Extremities:  No clubbing, cyanosis, edema, or deformity noted with normal full range of motion of all joints.   Neurologic:  alert & oriented X3 and gait normal.   Skin:  Intact without suspicious lesions or rashes Cervical Nodes:  No lymphadenopathy noted Axillary Nodes:  No palpable lymphadenopathy Psych:  Cognition and judgment appear intact. Alert and cooperative with normal attention span and concentration. No apparent delusions, illusions, hallucinations       Assessment & Plan:   Well woman exam with routine gynecological exam - Plan: Cytology - PAP( [place]),   Hyperlipidemia, unspecified hyperlipidemia type - Plan: Comprehensive metabolic panel, Lipid panel, TSH,   Iron deficiency anemia due to chronic blood loss - Plan: CBC with Differential/Platelet, Ferritin,   Irregular periods/menstrual cycles - No follow-ups on file.

## 2019-04-21 ENCOUNTER — Ambulatory Visit (INDEPENDENT_AMBULATORY_CARE_PROVIDER_SITE_OTHER): Payer: 59 | Admitting: Family Medicine

## 2019-04-21 ENCOUNTER — Encounter: Payer: Self-pay | Admitting: Family Medicine

## 2019-04-21 ENCOUNTER — Other Ambulatory Visit (HOSPITAL_COMMUNITY)
Admission: RE | Admit: 2019-04-21 | Discharge: 2019-04-21 | Disposition: A | Discharge status: Home / Self Care | Payer: 59 | Source: Ambulatory Visit | Attending: Family Medicine | Admitting: Family Medicine

## 2019-04-21 VITALS — BP 138/86 | HR 82 | Temp 98.2°F | Ht 61.5 in | Wt 164.2 lb

## 2019-04-21 DIAGNOSIS — Z01419 Encounter for gynecological examination (general) (routine) without abnormal findings: Secondary | ICD-10-CM | POA: Diagnosis not present

## 2019-04-21 DIAGNOSIS — N926 Irregular menstruation, unspecified: Secondary | ICD-10-CM

## 2019-04-21 DIAGNOSIS — D500 Iron deficiency anemia secondary to blood loss (chronic): Secondary | ICD-10-CM | POA: Diagnosis not present

## 2019-04-21 DIAGNOSIS — E785 Hyperlipidemia, unspecified: Secondary | ICD-10-CM

## 2019-04-21 DIAGNOSIS — Z113 Encounter for screening for infections with a predominantly sexual mode of transmission: Secondary | ICD-10-CM | POA: Diagnosis not present

## 2019-04-21 LAB — LIPID PANEL
Cholesterol: 191 mg/dL (ref 0–200)
HDL: 45.3 mg/dL (ref >39.00)
LDL Cholesterol: 124 mg/dL — ABNORMAL HIGH (ref 0–99)
NonHDL: 146.08
Total CHOL/HDL Ratio: 4
Triglycerides: 108 mg/dL (ref 0.0–149.0)
VLDL: 21.6 mg/dL (ref 0.0–40.0)

## 2019-04-21 LAB — COMPREHENSIVE METABOLIC PANEL
ALT: 11 U/L (ref 0–35)
AST: 14 U/L (ref 0–37)
Albumin: 4.6 g/dL (ref 3.5–5.2)
Alkaline Phosphatase: 48 U/L (ref 39–117)
BUN: 11 mg/dL (ref 6–23)
CO2: 23 mEq/L (ref 19–32)
Calcium: 9.3 mg/dL (ref 8.4–10.5)
Chloride: 104 mEq/L (ref 96–112)
Creatinine, Ser: 0.84 mg/dL (ref 0.40–1.20)
GFR: 91.9 mL/min (ref >60.00)
Glucose, Bld: 96 mg/dL (ref 70–99)
Potassium: 3.4 mEq/L — ABNORMAL LOW (ref 3.5–5.1)
Sodium: 137 mEq/L (ref 135–145)
Total Bilirubin: 0.4 mg/dL (ref 0.2–1.2)
Total Protein: 7.2 g/dL (ref 6.0–8.3)

## 2019-04-21 LAB — CBC WITH DIFFERENTIAL/PLATELET
Basophils Absolute: 0.1 10*3/uL (ref 0.0–0.1)
Basophils Relative: 0.8 % (ref 0.0–3.0)
Eosinophils Absolute: 0.1 10*3/uL (ref 0.0–0.7)
Eosinophils Relative: 1.3 % (ref 0.0–5.0)
HCT: 38.9 % (ref 36.0–46.0)
Hemoglobin: 12.6 g/dL (ref 12.0–15.0)
Lymphocytes Relative: 29.3 % (ref 12.0–46.0)
Lymphs Abs: 2.4 10*3/uL (ref 0.7–4.0)
MCHC: 32.5 g/dL (ref 30.0–36.0)
MCV: 85.3 fl (ref 78.0–100.0)
Monocytes Absolute: 0.5 10*3/uL (ref 0.1–1.0)
Monocytes Relative: 6.4 % (ref 3.0–12.0)
Neutro Abs: 5.1 10*3/uL (ref 1.4–7.7)
Neutrophils Relative %: 62.2 % (ref 43.0–77.0)
Platelets: 322 10*3/uL (ref 150.0–400.0)
RBC: 4.57 Mil/uL (ref 3.87–5.11)
RDW: 13.5 % (ref 11.5–15.5)
WBC: 8.2 10*3/uL (ref 4.0–10.5)

## 2019-04-21 LAB — FERRITIN: Ferritin: 27.3 ng/mL (ref 10.0–291.0)

## 2019-04-21 LAB — TSH: TSH: 1.38 u[IU]/mL (ref 0.35–4.50)

## 2019-04-21 NOTE — Patient Instructions (Signed)
Great to see you. I will call you with your lab results from today and you can view them online.   

## 2019-04-21 NOTE — Assessment & Plan Note (Signed)
Taking iron.  Due for labs today.

## 2019-04-21 NOTE — Assessment & Plan Note (Signed)
Reviewed preventive care protocols, scheduled due services, and updated immunizations Discussed nutrition, exercise, diet, and healthy lifestyle.  Pap smear done today.  She also requested STD testing- not having any symptoms.

## 2019-04-21 NOTE — Assessment & Plan Note (Signed)
Compliant with zocor.  Recheck labs today. Orders Placed This Encounter  Procedures  . CBC with Differential/Platelet  . Comprehensive metabolic panel  . Lipid panel  . TSH  . Ferritin  . HIV antibody (with reflex)  . RPR

## 2019-04-22 LAB — HIV ANTIBODY (ROUTINE TESTING W REFLEX): HIV 1&2 Ab, 4th Generation: NONREACTIVE

## 2019-04-22 LAB — RPR: RPR Ser Ql: NONREACTIVE

## 2019-04-23 LAB — CYTOLOGY - PAP
Adequacy: ABSENT
Chlamydia: NEGATIVE
Diagnosis: NEGATIVE
HPV: NOT DETECTED
Neisseria Gonorrhea: NEGATIVE
Trichomonas: NEGATIVE

## 2019-04-28 LAB — CYTOLOGY - PAP: Herpes: NEGATIVE

## 2019-07-08 MED FILL — SIMVASTATIN 40 MG TABLET: 40 | 90 days supply | Qty: 90 | Fill #2

## 2019-07-20 MED FILL — FLUTICASONE PROP 50 MCG SPR: 50 | 30 days supply | Qty: 16 | Fill #2

## 2019-10-11 MED FILL — FLUTICASONE PROP 50 MCG SPR: 50 | 30 days supply | Qty: 16 | Fill #3

## 2019-10-20 MED FILL — SIMVASTATIN 40 MG TABLET: 40 | 90 days supply | Qty: 90 | Fill #3

## 2019-11-15 DIAGNOSIS — Z20828 Contact with and (suspected) exposure to other viral communicable diseases: Secondary | ICD-10-CM | POA: Diagnosis not present

## 2019-11-26 ENCOUNTER — Telehealth: Payer: Self-pay | Admitting: General Practice

## 2019-11-26 NOTE — Telephone Encounter (Signed)
Patient is calling and requesting a transfer care to Dr. Barron Alvine. Pt is a former Dr. Elmer Sow patient. Pls advise. CB is [phone].

## 2019-11-30 NOTE — Telephone Encounter (Signed)
MC-Pt is wanting to schedule a TOC visit with you/plz advise/thx dmf 

## 2019-12-01 NOTE — Telephone Encounter (Signed)
Pt aware, will call back later to schedule TOC

## 2019-12-01 NOTE — Telephone Encounter (Signed)
Plz call pt to sched TOC visit with Dr. C/thx dmf 

## 2019-12-01 NOTE — Telephone Encounter (Signed)
Ok with me 

## 2020-01-19 ENCOUNTER — Other Ambulatory Visit: Payer: Self-pay | Admitting: Family Medicine

## 2020-01-19 MED FILL — FLUTICASONE PROP 50 MCG SPR: 50 | 60 days supply | Qty: 16 | Fill #0

## 2020-02-03 ENCOUNTER — Other Ambulatory Visit (HOSPITAL_COMMUNITY): Payer: Self-pay | Admitting: Family Medicine

## 2020-02-03 MED FILL — SIMVASTATIN 40 MG TABLET: 40 | 90 days supply | Qty: 90 | Fill #0

## 2020-02-10 ENCOUNTER — Other Ambulatory Visit: Payer: Self-pay

## 2020-02-10 ENCOUNTER — Ambulatory Visit (INDEPENDENT_AMBULATORY_CARE_PROVIDER_SITE_OTHER): Payer: 59 | Admitting: Family Medicine

## 2020-02-10 ENCOUNTER — Other Ambulatory Visit (HOSPITAL_COMMUNITY)
Admission: RE | Admit: 2020-02-10 | Discharge: 2020-02-10 | Disposition: A | Discharge status: Home / Self Care | Payer: 59 | Source: Ambulatory Visit | Attending: Family Medicine | Admitting: Family Medicine

## 2020-02-10 ENCOUNTER — Encounter: Payer: Self-pay | Admitting: Family Medicine

## 2020-02-10 VITALS — BP 140/98 | HR 86 | Temp 96.8°F | Ht 62.25 in | Wt 170.0 lb

## 2020-02-10 DIAGNOSIS — R030 Elevated blood-pressure reading, without diagnosis of hypertension: Secondary | ICD-10-CM | POA: Diagnosis not present

## 2020-02-10 DIAGNOSIS — M533 Sacrococcygeal disorders, not elsewhere classified: Secondary | ICD-10-CM

## 2020-02-10 DIAGNOSIS — M25552 Pain in left hip: Secondary | ICD-10-CM | POA: Diagnosis not present

## 2020-02-10 DIAGNOSIS — G8929 Other chronic pain: Secondary | ICD-10-CM

## 2020-02-10 DIAGNOSIS — Z0000 Encounter for general adult medical examination without abnormal findings: Secondary | ICD-10-CM | POA: Diagnosis not present

## 2020-02-10 DIAGNOSIS — E785 Hyperlipidemia, unspecified: Secondary | ICD-10-CM

## 2020-02-10 DIAGNOSIS — D500 Iron deficiency anemia secondary to blood loss (chronic): Secondary | ICD-10-CM | POA: Diagnosis not present

## 2020-02-10 DIAGNOSIS — Z113 Encounter for screening for infections with a predominantly sexual mode of transmission: Secondary | ICD-10-CM | POA: Insufficient documentation

## 2020-02-10 NOTE — Progress Notes (Signed)
Tina Cooper is a 39 y.o. female  Chief Complaint  Patient presents with  . Establish Care    Pt here for physical and she is fasting for lab work today.    HPI: Tina Cooper is a 39 y.o. female here for annual CPE, fasting labs.  She requests STD screening including HSV 1 and 2 screening. She denies any symptoms or known exposures. Pt is an Charity fundraiser, currently works at [employer].   She takes her iron supplement but only takes it around her cycle for the past few months.   She requests referral to sports med for chronic, intermittent Lt hip and SI joint pain. She was previously treated by Dr. Dayton Martes with 2 courses of prednisone with only temporary improvement. Pt states next step was referral to sports med but pt has not f/u on that until now.  Last PAP: 04/2019 - due in 3 years  Diet/Exercise: average diet, no pork; no regular exercise routine Dentist: UTD Vision: does not wear glasses or contacts; has appt next week  Med refills needed today? none  Past Medical History:  Diagnosis Date  . Anemia   . Hyperlipidemia     Past Surgical History:  Procedure Laterality Date  . WISDOM TOOTH EXTRACTION      Social History   Socioeconomic History  . Marital status: Single    Spouse name: Not on file  . Number of children: Not on file  . Years of education: Not on file  . Highest education level: Not on file  Occupational History  . Occupation: Teacher, adult education: [employer]  Tobacco Use  . Smoking status: Never Smoker  . Smokeless tobacco: Never Used  Substance and Sexual Activity  . Alcohol use: Yes    Alcohol/week: 0.0 standard drinks    Comment: occasional  . Drug use: No  . Sexual activity: Yes    Birth control/protection: None  Other Topics Concern  . Not on file  Social History Narrative  . Not on file   Social Determinants of Health   Financial Resource Strain:   . Difficulty of Paying Living Expenses:   Food Insecurity:   . Worried About Patent examiner in the Last Year:   . Barista in the Last Year:   Transportation Needs:   . Freight forwarder (Medical):   Marland Kitchen Lack of Transportation (Non-Medical):   Physical Activity:   . Days of Exercise per Week:   . Minutes of Exercise per Session:   Stress:   . Feeling of Stress :   Social Connections:   . Frequency of Communication with Friends and Family:   . Frequency of Social Gatherings with Friends and Family:   . Attends Religious Services:   . Active Member of Clubs or Organizations:   . Attends Banker Meetings:   Marland Kitchen Marital Status:   Intimate Partner Violence:   . Fear of Current or Ex-Partner:   . Emotionally Abused:   Marland Kitchen Physically Abused:   . Sexually Abused:     Family History  Problem Relation Age of Onset  . Diabetes Mother   . Hypertension Mother   . Hyperlipidemia Father   . Hypertension Father      Immunization History  Administered Date(s) Administered  . Influenza Whole 07/21/2009  . Influenza-Unspecified 07/07/2014  . Td 08/28/2005    Outpatient Encounter Medications as of 02/10/2020  Medication Sig  . b complex vitamins  capsule Take 1 capsule by mouth daily.  Marland Kitchen docusate sodium (COLACE) 100 MG capsule Take 100 mg by mouth 2 (two) times daily.  . ferrous sulfate 325 (65 FE) MG tablet Take 325 mg by mouth daily with breakfast.  . fluticasone (FLONASE) 50 MCG/ACT nasal spray PLACE 2 SPRAYS IN EACH NOSTRIL ONCE A DAY  . simvastatin (ZOCOR) 40 MG tablet TAKE 1 TABLET BY MOUTH EVERY EVENING   No facility-administered encounter medications on file as of 02/10/2020.     ROS: Gen: no fever, chills  Skin: no rash, itching ENT: no ear pain, ear drainage, nasal congestion, rhinorrhea, sinus pressure, sore throat Eyes: no blurry vision, double vision Resp: no cough, wheeze,SOB Breast: no breast tenderness, no nipple discharge, no breast masses CV: no CP, palpitations, LE edema,  GI: no heartburn, n/v/d/c, abd pain GU: no dysuria,  urgency, frequency, hematuria MSK: as above in HPI Neuro: no dizziness, headache, weakness, vertigo Psych: no depression, anxiety, insomnia   No Known Allergies  BP (!) 140/98 (BP Location: Left Arm, Patient Position: Sitting, Cuff Size: Normal)   Pulse 86   Temp (!) 96.8 F (36 C) (Temporal)   Ht 5' 2.25" (1.581 m)   Wt 170 lb (77.1 kg)   LMP 02/09/2020   SpO2 100%   BMI 30.84 kg/m   BP Readings from Last 3 Encounters:  02/10/20 (!) 140/98  04/21/19 138/86  06/29/18 118/78     Physical Exam  Constitutional: She is oriented to person, place, and time. She appears well-developed and well-nourished. No distress.  HENT:  Head: Normocephalic and atraumatic.  Right Ear: Tympanic membrane and ear canal normal.  Left Ear: Tympanic membrane and ear canal normal.  Nose: Nose normal.  Mouth/Throat: Oropharynx is clear and moist and mucous membranes are normal.  Eyes: Pupils are equal, round, and reactive to light. Conjunctivae are normal.  Neck: No thyromegaly present.  Cardiovascular: Normal rate, regular rhythm, normal heart sounds and intact distal pulses.  No murmur heard. Pulmonary/Chest: Effort normal and breath sounds normal. No respiratory distress. She has no wheezes. She has no rhonchi.  Abdominal: Soft. Bowel sounds are normal. She exhibits no distension and no mass. There is no abdominal tenderness.  Musculoskeletal:        General: No edema.     Cervical back: Neck supple.  Lymphadenopathy:    She has no cervical adenopathy.  Neurological: She is alert and oriented to person, place, and time. She exhibits normal muscle tone. Coordination normal.  Skin: Skin is warm and dry.  Psychiatric: She has a normal mood and affect. Her behavior is normal.     A/P:  1. Annual physical exam - discussed importance of regular CV exercise, healthy diet, adequate sleep - UTD on PAP - UTD on dentist and has vision appt scheduled - immunizations UTD - ALT - AST - Basic  metabolic panel - CBC - Lipid panel - VITAMIN D 25 Hydroxy (Vit-D Deficiency, Fractures) - next CPE in 1 year  2. Hyperlipidemia, unspecified hyperlipidemia type - on zocor 40mg  daily - Lipid panel  3. Iron deficiency anemia due to chronic blood loss - take iron supplement but not consistently - CBC - Iron, TIBC and Ferritin Panel  4. Screen for STD (sexually transmitted disease) - Hepatitis B surface antigen - HIV Antibody (routine testing w rflx) - RPR - Hepatitis C Antibody - Urine cytology ancillary only([facility] [place]) - HSV(herpes simplex vrs) 1+2 ab-IgM - pt requests this lab be done despite it not  being part of standard screening test or recommended as such - HSV(herpes simplex vrs) 1+2 ab-IgG - pt requests this lab be done despite it not being part of standard screening test or recommended as such  5. SI (sacroiliac) pain 6. Chronic left hip pain - Ambulatory referral to Sports Medicine - treated by previous PCP with prednisone taper x 2 with only temporary relief. Pt states next step was referral to sports med and she would like to proceed with that  7. Elevated BP without diagnosis of hypertension - fam h/o HTN and recent BP readings when pt checks at work have been elevated at times - plan for pt to check BP 3-4x/wk x 2 wks and send MyChart message with readings  This visit occurred during the SARS-CoV-2 public health emergency.  Safety protocols were in place, including screening questions prior to the visit, additional usage of staff PPE, and extensive cleaning of exam room while observing appropriate contact time as indicated for disinfecting solutions.

## 2020-02-10 NOTE — Patient Instructions (Signed)
Health Maintenance Due  Topic Date Due  . COVID-19 Vaccine (1) Never done  . TETANUS/TDAP  08/29/2015    Depression screen PHQ 2/9 06/29/2018 06/19/2017  Decreased Interest 0 0  Down, Depressed, Hopeless 0 0  PHQ - 2 Score 0 0

## 2020-02-11 ENCOUNTER — Ambulatory Visit: Payer: 59 | Admitting: Family Medicine

## 2020-02-11 NOTE — Addendum Note (Signed)
Addended by: Varney Biles on: 02/11/2020 02:25 PM   Modules accepted: Orders

## 2020-02-14 LAB — URINE CYTOLOGY ANCILLARY ONLY
Chlamydia: NEGATIVE
Comment: NEGATIVE
Comment: NORMAL
Neisseria Gonorrhea: NEGATIVE

## 2020-02-15 DIAGNOSIS — H52223 Regular astigmatism, bilateral: Secondary | ICD-10-CM | POA: Diagnosis not present

## 2020-02-16 ENCOUNTER — Other Ambulatory Visit (INDEPENDENT_AMBULATORY_CARE_PROVIDER_SITE_OTHER): Payer: 59

## 2020-02-16 DIAGNOSIS — E785 Hyperlipidemia, unspecified: Secondary | ICD-10-CM

## 2020-02-16 DIAGNOSIS — D500 Iron deficiency anemia secondary to blood loss (chronic): Secondary | ICD-10-CM | POA: Diagnosis not present

## 2020-02-16 DIAGNOSIS — Z113 Encounter for screening for infections with a predominantly sexual mode of transmission: Secondary | ICD-10-CM

## 2020-02-16 DIAGNOSIS — Z0000 Encounter for general adult medical examination without abnormal findings: Secondary | ICD-10-CM

## 2020-02-16 LAB — BASIC METABOLIC PANEL
BUN: 11 mg/dL (ref 6–23)
CO2: 24 mEq/L (ref 19–32)
Calcium: 8.9 mg/dL (ref 8.4–10.5)
Chloride: 101 mEq/L (ref 96–112)
Creatinine, Ser: 0.78 mg/dL (ref 0.40–1.20)
GFR: 99.67 mL/min (ref >60.00)
Glucose, Bld: 98 mg/dL (ref 70–99)
Potassium: 3.4 mEq/L — ABNORMAL LOW (ref 3.5–5.1)
Sodium: 135 mEq/L (ref 135–145)

## 2020-02-16 LAB — LIPID PANEL
Cholesterol: 211 mg/dL — ABNORMAL HIGH (ref 0–200)
HDL: 49.5 mg/dL (ref >39.00)
LDL Cholesterol: 134 mg/dL — ABNORMAL HIGH (ref 0–99)
NonHDL: 161.18
Total CHOL/HDL Ratio: 4
Triglycerides: 136 mg/dL (ref 0.0–149.0)
VLDL: 27.2 mg/dL (ref 0.0–40.0)

## 2020-02-16 LAB — CBC
HCT: 34.4 % — ABNORMAL LOW (ref 36.0–46.0)
Hemoglobin: 11.1 g/dL — ABNORMAL LOW (ref 12.0–15.0)
MCHC: 32.1 g/dL (ref 30.0–36.0)
MCV: 76.2 fl — ABNORMAL LOW (ref 78.0–100.0)
Platelets: 357 10*3/uL (ref 150.0–400.0)
RBC: 4.52 Mil/uL (ref 3.87–5.11)
RDW: 15.8 % — ABNORMAL HIGH (ref 11.5–15.5)
WBC: 8.6 10*3/uL (ref 4.0–10.5)

## 2020-02-16 LAB — VITAMIN D 25 HYDROXY (VIT D DEFICIENCY, FRACTURES): VITD: 14.33 ng/mL — ABNORMAL LOW (ref 30.00–100.00)

## 2020-02-16 LAB — ALT: ALT: 13 U/L (ref 0–35)

## 2020-02-16 LAB — AST: AST: 15 U/L (ref 0–37)

## 2020-02-17 ENCOUNTER — Encounter: Payer: Self-pay | Admitting: Family Medicine

## 2020-02-17 ENCOUNTER — Other Ambulatory Visit: Payer: Self-pay | Admitting: Family Medicine

## 2020-02-17 DIAGNOSIS — E559 Vitamin D deficiency, unspecified: Secondary | ICD-10-CM | POA: Insufficient documentation

## 2020-02-17 LAB — IRON,TIBC AND FERRITIN PANEL
%SAT: 7 % (calc) — ABNORMAL LOW (ref 16–45)
Ferritin: 13 ng/mL — ABNORMAL LOW (ref 16–154)
Iron: 28 ug/dL — ABNORMAL LOW (ref 40–190)
TIBC: 412 mcg/dL (calc) (ref 250–450)

## 2020-02-17 LAB — RPR: RPR Ser Ql: NONREACTIVE

## 2020-02-17 LAB — HEPATITIS C ANTIBODY
Hepatitis C Ab: NONREACTIVE
SIGNAL TO CUT-OFF: 0.01 (ref <1.00)

## 2020-02-17 LAB — HSV(HERPES SIMPLEX VRS) I + II AB-IGG
HAV 1 IGG,TYPE SPECIFIC AB: <0.9 index
HSV 2 IGG,TYPE SPECIFIC AB: <0.9 index

## 2020-02-17 LAB — HIV ANTIBODY (ROUTINE TESTING W REFLEX): HIV 1&2 Ab, 4th Generation: NONREACTIVE

## 2020-02-17 LAB — HEPATITIS B SURFACE ANTIGEN: Hepatitis B Surface Ag: NONREACTIVE

## 2020-02-17 MED ORDER — VITAMIN D (ERGOCALCIFEROL) 1.25 MG (50000 UNIT) PO CAPS: 50000.0000 [IU] | ORAL_CAPSULE | ORAL | 3 refills | Status: DC

## 2020-02-18 LAB — SPECIMEN STATUS

## 2020-02-18 LAB — SPECIMEN STATUS REPORT

## 2020-02-18 LAB — HSV(HERPES SIMPLEX VRS) I + II AB-IGM: HSVI/II Comb IgM: <0.91 Ratio (ref 0.00–0.90)

## 2020-03-29 MED FILL — FLUTICASONE PROP 50 MCG SPR: 50 | 60 days supply | Qty: 16 | Fill #1

## 2020-04-07 ENCOUNTER — Encounter: Payer: Self-pay | Admitting: Family Medicine

## 2020-04-07 ENCOUNTER — Other Ambulatory Visit: Payer: Self-pay | Admitting: Family Medicine

## 2020-04-20 ENCOUNTER — Other Ambulatory Visit: Payer: Self-pay

## 2020-04-21 ENCOUNTER — Ambulatory Visit: Payer: 59 | Admitting: Family Medicine

## 2020-04-21 ENCOUNTER — Encounter: Payer: Self-pay | Admitting: Family Medicine

## 2020-04-21 VITALS — BP 165/110 | HR 95 | Temp 97.8°F | Ht 62.25 in | Wt 172.8 lb

## 2020-04-21 DIAGNOSIS — I10 Essential (primary) hypertension: Secondary | ICD-10-CM | POA: Diagnosis not present

## 2020-04-21 DIAGNOSIS — R600 Localized edema: Secondary | ICD-10-CM | POA: Diagnosis not present

## 2020-04-21 DIAGNOSIS — N939 Abnormal uterine and vaginal bleeding, unspecified: Secondary | ICD-10-CM

## 2020-04-21 MED ORDER — LOSARTAN POTASSIUM-HCTZ 50-12.5 MG PO TABS: 1.0000 | ORAL_TABLET | Freq: Every day | ORAL | 0 refills | Status: DC

## 2020-04-21 MED ORDER — FUROSEMIDE 20 MG PO TABS: ORAL_TABLET | ORAL | 1 refills | Status: DC

## 2020-04-21 MED FILL — LOSARTAN-HCTZ 50-12.5 MG TA: 50-12.5 | 90 days supply | Qty: 90 | Fill #0

## 2020-04-21 MED FILL — FUROSEMIDE 20 MG TABS: 20 | 30 days supply | Qty: 30 | Fill #0

## 2020-04-21 NOTE — Progress Notes (Signed)
Tina Cooper is a 39 y.o. female  Chief Complaint  Patient presents with   Acute Visit    Pt c/o vaginal discharge and leg swelling.  Pt explains that she has a hx leg swelling, she traveled end of June and has went down some.  Pt said that she has been spotting since Covid vaccine, 1st inject in May and sec in June.  Pt said there has been spotting of red and Vogler discharge since recieving the injections.  Pt has to were a liner everyday.     HPI: Tina Cooper is a 39 y.o. female who complains of  1. Leg swelling while traveling 2-3 wks ago. She went to [place] and was walking in the heat, flew on airplane. Swelling has resolved. This is not a new issue for pt and has happened before, usually related to travel. She previously had PRN Rx for HCTZ 12.5mg  tabs and she would take 25mg  PRN  2. Pt complains of light vaginal bleeding/spotting since COVID vaccine in 02/2020 and second in 03/2020. Pt denies heavy bleeding, pelvic pain or cramping. She does wear a panty liner daily.   3. She has been checking BP at home a few times this week and BP readings have been high 160-170's/90-100's. Pt can feel it is running high and had expressed this concern at OV in 02/2020.   Past Medical History:  Diagnosis Date   Anemia    Hyperlipidemia     Past Surgical History:  Procedure Laterality Date   WISDOM TOOTH EXTRACTION      Social History   Socioeconomic History   Marital status: Single    Spouse name: Not on file   Number of children: Not on file   Years of education: Not on file   Highest education level: Not on file  Occupational History   Occupation: RN    Employer: [employer]  Tobacco Use   Smoking status: Never Smoker   Smokeless tobacco: Never Used  Substance and Sexual Activity   Alcohol use: Yes    Alcohol/week: 0.0 standard drinks    Comment: occasional   Drug use: No   Sexual activity: Yes    Birth control/protection: None  Other Topics Concern   Not on  file  Social History Narrative   Not on file   Social Determinants of Health   Financial Resource Strain:    Difficulty of Paying Living Expenses:   Food Insecurity:    Worried About 03/2020 in the Last Year:    Programme researcher, broadcasting/film/video in the Last Year:   Transportation Needs:    Barista (Medical):    Lack of Transportation (Non-Medical):   Physical Activity:    Days of Exercise per Week:    Minutes of Exercise per Session:   Stress:    Feeling of Stress :   Social Connections:    Frequency of Communication with Friends and Family:    Frequency of Social Gatherings with Friends and Family:    Attends Religious Services:    Active Member of Clubs or Organizations:    Attends Freight forwarder:    Marital Status:   Intimate Partner Violence:    Fear of Current or Ex-Partner:    Emotionally Abused:    Physically Abused:    Sexually Abused:     Family History  Problem Relation Age of Onset   Diabetes Mother    Hypertension Mother    Hyperlipidemia  Father    Hypertension Father      Immunization History  Administered Date(s) Administered   Influenza Whole 07/21/2009   Influenza-Unspecified 07/07/2014   Td 08/28/2005    Outpatient Encounter Medications as of 04/21/2020  Medication Sig   b complex vitamins capsule Take 1 capsule by mouth daily.   docusate sodium (COLACE) 100 MG capsule Take 100 mg by mouth 2 (two) times daily.   ferrous sulfate 325 (65 FE) MG tablet Take 325 mg by mouth daily with breakfast.   fluticasone (FLONASE) 50 MCG/ACT nasal spray PLACE 2 SPRAYS IN EACH NOSTRIL ONCE A DAY   simvastatin (ZOCOR) 40 MG tablet TAKE 1 TABLET BY MOUTH EVERY EVENING   Vitamin D, Ergocalciferol, (DRISDOL) 1.25 MG (50000 UNIT) CAPS capsule Take 1 capsule (50,000 Units total) by mouth every 7 (seven) days.   furosemide (LASIX) 20 MG tablet 1/2 to 1 tab po daily PRN   losartan-hydrochlorothiazide (HYZAAR)  50-12.5 MG tablet Take 1 tablet by mouth daily.   No facility-administered encounter medications on file as of 04/21/2020.     ROS: Pertinent positives and negatives noted in HPI. Remainder of ROS non-contributory   No Known Allergies  Pulse 95    Temp 97.8 F (36.6 C) (Temporal)    Ht 5' 2.25" (1.581 m)    Wt 172 lb 12.8 oz (78.4 kg)    LMP 04/01/2020    SpO2 97%    BMI 31.35 kg/m    BP Readings from Last 3 Encounters:  02/10/20 (!) 140/98  04/21/19 138/86  06/29/18 118/78     Physical Exam Constitutional:      General: She is not in acute distress.    Appearance: She is not ill-appearing.  Pulmonary:     Effort: No respiratory distress.  Musculoskeletal:     Right lower leg: No edema.     Left lower leg: No edema.  Neurological:     Mental Status: She is alert and oriented to person, place, and time.  Psychiatric:        Mood and Affect: Mood normal.        Behavior: Behavior normal.      A/P:  1. Essential hypertension - home BPs 160-170/90-100  Rx: - losartan-hydrochlorothiazide (HYZAAR) 50-12.5 MG tablet; Take 1 tablet by mouth daily.  Dispense: 90 tablet; Refill: 0 - check BP at home 3-4x/wk x 2 wks and send readings via MyChart or call - low sodium diet, regular exercise Discussed plan and reviewed medications with patient, including risks, benefits, and potential side effects. Pt expressed understand. All questions answered.  2. Bilateral lower extremity edema - occurs mainly with travel Rx: - furosemide (LASIX) 20 MG tablet; 1/2 to 1 tab po daily PRN  Dispense: 30 tablet; Refill: 1  3. Vaginal spotting - occurring since 1st covid vaccine in 02/2020 - not heavy and no other symptoms - will follow for now and if symptoms persist longer than another 2 mo, will refer to GYN    This visit occurred during the SARS-CoV-2 public health emergency.  Safety protocols were in place, including screening questions prior to the visit, additional usage of staff  PPE, and extensive cleaning of exam room while observing appropriate contact time as indicated for disinfecting solutions.

## 2020-05-17 MED FILL — SIMVASTATIN 40 MG TABLET: 40 | 90 days supply | Qty: 90 | Fill #1

## 2020-05-30 MED FILL — FLUTICASONE PROP 50 MCG SPR: 50 | 60 days supply | Qty: 16 | Fill #2

## 2020-07-19 ENCOUNTER — Other Ambulatory Visit: Payer: Self-pay | Admitting: Family Medicine

## 2020-07-19 DIAGNOSIS — I10 Essential (primary) hypertension: Secondary | ICD-10-CM

## 2020-07-19 MED FILL — LOSARTAN-HCTZ 50-12.5 MG TA: 50-12.5 | 90 days supply | Qty: 90 | Fill #0

## 2020-07-28 MED FILL — FLUTICASONE PROP 50 MCG SPR: 50 | 30 days supply | Qty: 16 | Fill #3

## 2020-08-12 MED FILL — FLUTICASONE PROP 50 MCG SPR: 50 | 60 days supply | Qty: 16 | Fill #3

## 2020-08-15 MED FILL — SIMVASTATIN 40 MG TABLET: 40 | 90 days supply | Qty: 90 | Fill #2

## 2020-10-18 ENCOUNTER — Other Ambulatory Visit: Payer: Self-pay | Admitting: Family Medicine

## 2020-10-18 DIAGNOSIS — I10 Essential (primary) hypertension: Secondary | ICD-10-CM

## 2020-10-18 MED FILL — LOSARTAN-HCTZ 50-12.5 MG TA: 50-12.5 | 30 days supply | Qty: 30 | Fill #0

## 2020-10-18 MED FILL — FLUTICASONE PROP 50 MCG SPR: 50 | 60 days supply | Qty: 16 | Fill #4

## 2020-11-16 MED FILL — SIMVASTATIN 40 MG TABLET: 40 | 90 days supply | Qty: 90 | Fill #3

## 2020-11-17 MED FILL — LOSARTAN-HCTZ 50-12.5 MG TA: 50-12.5 | 30 days supply | Qty: 30 | Fill #1

## 2021-01-17 ENCOUNTER — Other Ambulatory Visit: Payer: Self-pay | Admitting: Family Medicine

## 2021-01-17 DIAGNOSIS — I10 Essential (primary) hypertension: Secondary | ICD-10-CM

## 2021-01-18 ENCOUNTER — Other Ambulatory Visit (HOSPITAL_COMMUNITY): Payer: Self-pay

## 2021-01-18 MED ORDER — LOSARTAN POTASSIUM-HCTZ 50-12.5 MG PO TABS
1.0000 | ORAL_TABLET | Freq: Every day | ORAL | 0 refills | Status: DC
  Filled 2021-01-18: qty 90, 90d supply, fill #0

## 2021-01-24 ENCOUNTER — Other Ambulatory Visit (HOSPITAL_COMMUNITY): Payer: Self-pay

## 2021-02-03 ENCOUNTER — Other Ambulatory Visit (HOSPITAL_COMMUNITY): Payer: Self-pay

## 2021-02-03 MED ORDER — OMRON 3 SERIES BP MONITOR DEVI
0 refills | Status: DC
  Filled 2021-02-03: qty 1, 30d supply, fill #0

## 2021-02-20 ENCOUNTER — Other Ambulatory Visit: Payer: Self-pay

## 2021-02-21 ENCOUNTER — Other Ambulatory Visit: Payer: Self-pay | Admitting: Family Medicine

## 2021-02-21 ENCOUNTER — Other Ambulatory Visit (HOSPITAL_COMMUNITY): Payer: Self-pay

## 2021-02-21 MED ORDER — SIMVASTATIN 40 MG PO TABS
40.0000 mg | ORAL_TABLET | Freq: Every evening | ORAL | 0 refills | Status: DC
  Filled 2021-02-21: qty 90, 90d supply, fill #0

## 2021-03-01 ENCOUNTER — Other Ambulatory Visit (HOSPITAL_COMMUNITY): Payer: Self-pay

## 2021-03-12 ENCOUNTER — Other Ambulatory Visit: Payer: Self-pay | Admitting: Family Medicine

## 2021-03-12 ENCOUNTER — Other Ambulatory Visit (HOSPITAL_COMMUNITY): Payer: Self-pay

## 2021-03-12 MED ORDER — FLUTICASONE PROPIONATE 50 MCG/ACT NA SUSP
NASAL | 5 refills | Status: DC
  Filled 2021-03-12: qty 16, 30d supply, fill #0
  Filled 2021-05-30: qty 16, 30d supply, fill #1
  Filled 2021-08-14: qty 16, 30d supply, fill #2
  Filled 2021-10-20: qty 16, 30d supply, fill #3
  Filled 2021-12-18: qty 16, 30d supply, fill #4
  Filled 2022-02-09: qty 16, 30d supply, fill #5

## 2021-03-15 ENCOUNTER — Other Ambulatory Visit (HOSPITAL_COMMUNITY): Payer: Self-pay

## 2021-03-28 ENCOUNTER — Other Ambulatory Visit: Payer: Self-pay

## 2021-04-10 ENCOUNTER — Other Ambulatory Visit (HOSPITAL_COMMUNITY): Payer: Self-pay

## 2021-04-24 ENCOUNTER — Other Ambulatory Visit: Payer: Self-pay | Admitting: Family Medicine

## 2021-04-24 DIAGNOSIS — I10 Essential (primary) hypertension: Secondary | ICD-10-CM

## 2021-04-25 ENCOUNTER — Other Ambulatory Visit (HOSPITAL_COMMUNITY): Payer: Self-pay

## 2021-04-25 MED ORDER — LOSARTAN POTASSIUM-HCTZ 50-12.5 MG PO TABS
1.0000 | ORAL_TABLET | Freq: Every day | ORAL | 0 refills | Status: DC
  Filled 2021-04-25: qty 90, 90d supply, fill #0

## 2021-04-27 ENCOUNTER — Other Ambulatory Visit: Payer: Self-pay

## 2021-04-27 ENCOUNTER — Ambulatory Visit (INDEPENDENT_AMBULATORY_CARE_PROVIDER_SITE_OTHER): Payer: 59 | Admitting: Family Medicine

## 2021-04-27 ENCOUNTER — Other Ambulatory Visit (HOSPITAL_COMMUNITY): Payer: Self-pay

## 2021-04-27 ENCOUNTER — Other Ambulatory Visit (HOSPITAL_COMMUNITY)
Admission: RE | Admit: 2021-04-27 | Discharge: 2021-04-27 | Disposition: A | Discharge status: Home / Self Care | Payer: 59 | Source: Ambulatory Visit | Attending: Family Medicine | Admitting: Family Medicine

## 2021-04-27 ENCOUNTER — Encounter: Payer: Self-pay | Admitting: Family Medicine

## 2021-04-27 VITALS — BP 126/86 | HR 77 | Temp 97.2°F | Ht 61.0 in | Wt 171.0 lb

## 2021-04-27 DIAGNOSIS — R600 Localized edema: Secondary | ICD-10-CM | POA: Diagnosis not present

## 2021-04-27 DIAGNOSIS — Z113 Encounter for screening for infections with a predominantly sexual mode of transmission: Secondary | ICD-10-CM | POA: Insufficient documentation

## 2021-04-27 DIAGNOSIS — I10 Essential (primary) hypertension: Secondary | ICD-10-CM | POA: Diagnosis not present

## 2021-04-27 DIAGNOSIS — Z0000 Encounter for general adult medical examination without abnormal findings: Secondary | ICD-10-CM | POA: Diagnosis not present

## 2021-04-27 DIAGNOSIS — E559 Vitamin D deficiency, unspecified: Secondary | ICD-10-CM

## 2021-04-27 DIAGNOSIS — E785 Hyperlipidemia, unspecified: Secondary | ICD-10-CM | POA: Diagnosis not present

## 2021-04-27 MED ORDER — SIMVASTATIN 40 MG PO TABS
40.0000 mg | ORAL_TABLET | Freq: Every evening | ORAL | 3 refills | Status: DC
  Filled 2021-04-27: qty 90, 90d supply, fill #0

## 2021-04-27 MED ORDER — FUROSEMIDE 20 MG PO TABS
ORAL_TABLET | ORAL | 1 refills | Status: DC
Start: 2021-04-27 — End: 2023-03-10
  Filled 2021-04-27 – 2021-06-02 (×2): qty 30, 30d supply, fill #0
  Filled 2022-04-16: qty 30, 30d supply, fill #1

## 2021-04-27 MED ORDER — LOSARTAN POTASSIUM-HCTZ 50-12.5 MG PO TABS
1.0000 | ORAL_TABLET | Freq: Every day | ORAL | 3 refills | Status: DC
Start: 2021-04-27 — End: 2021-12-21
  Filled 2021-04-27 – 2021-07-19 (×2): qty 90, 90d supply, fill #0
  Filled 2021-10-30: qty 90, 90d supply, fill #1

## 2021-04-27 NOTE — Progress Notes (Signed)
Tina Cooper is a 40 y.o. female  Chief Complaint  Patient presents with   Annual Exam    CPE with Breast exam.    HPI: Tina Cooper is a 40 y.o. female patient seen today for annual CPE, labs. Pt is fasting for labs. She last ate at 7am today - 6.5hrs ago.  Last PAP: UTD 04/2019 - due in 04/2022  Diet/Exercise: average diet;  Dental: UTD Vision: has appt scheduled  Med refills needed today? Yes per order  Tdap vaccine - believes she is UTD  Past Medical History:  Diagnosis Date   Anemia    Hyperlipidemia     Past Surgical History:  Procedure Laterality Date   WISDOM TOOTH EXTRACTION      Social History   Socioeconomic History   Marital status: Single    Spouse name: Not on file   Number of children: Not on file   Years of education: Not on file   Highest education level: Not on file  Occupational History   Occupation: RN    Employer: [employer]  Tobacco Use   Smoking status: Never   Smokeless tobacco: Never  Substance and Sexual Activity   Alcohol use: Yes    Alcohol/week: 0.0 standard drinks    Comment: occasional   Drug use: No   Sexual activity: Yes    Birth control/protection: None  Other Topics Concern   Not on file  Social History Narrative   Not on file   Social Determinants of Health   Financial Resource Strain: Not on file  Food Insecurity: Not on file  Transportation Needs: Not on file  Physical Activity: Not on file  Stress: Not on file  Social Connections: Not on file  Intimate Partner Violence: Not on file    Family History  Problem Relation Age of Onset   Diabetes Mother    Hypertension Mother    Hyperlipidemia Father    Hypertension Father      Immunization History  Administered Date(s) Administered   Influenza Whole 07/21/2009   Influenza-Unspecified 07/07/2014   Td 08/28/2005    Outpatient Encounter Medications as of 04/27/2021  Medication Sig   b complex vitamins capsule Take 1 capsule by mouth daily.    docusate sodium (COLACE) 100 MG capsule Take 100 mg by mouth 2 (two) times daily.   ferrous sulfate 325 (65 FE) MG tablet Take 325 mg by mouth daily with breakfast.   fluticasone (FLONASE) 50 MCG/ACT nasal spray PLACE 2 SPRAYS IN EACH NOSTRIL ONCE A DAY   Vitamin D, Ergocalciferol, (DRISDOL) 1.25 MG (50000 UNIT) CAPS capsule Take 1 capsule (50,000 Units total) by mouth every 7 (seven) days.   [DISCONTINUED] furosemide (LASIX) 20 MG tablet 1/2 to 1 tab po daily PRN   [DISCONTINUED] losartan-hydrochlorothiazide (HYZAAR) 50-12.5 MG tablet Take 1 tablet by mouth daily. **Needs appointment before anymore refills given**   [DISCONTINUED] simvastatin (ZOCOR) 40 MG tablet TAKE 1 TABLET BY MOUTH EVERY EVENING   Blood Pressure Monitoring (OMRON 3 SERIES BP MONITOR) DEVI Use as directed (Patient not taking: Reported on 04/27/2021)   furosemide (LASIX) 20 MG tablet 1/2 to 1 tab po daily PRN   losartan-hydrochlorothiazide (HYZAAR) 50-12.5 MG tablet Take 1 tablet by mouth daily.   simvastatin (ZOCOR) 40 MG tablet Take 1 tablet (40 mg total) by mouth every evening.   [DISCONTINUED] simvastatin (ZOCOR) 40 MG tablet Take 1 tablet (40 mg total) by mouth every evening. ** Need appt before anymore refills**  No facility-administered encounter medications on file as of 04/27/2021.     ROS: Gen: no fever, chills  Skin: no rash, itching ENT: no ear pain, ear drainage, nasal congestion, rhinorrhea, sinus pressure, sore throat Eyes: no blurry vision, double vision Resp: no cough, wheeze,SOB Breast: no breast tenderness, no nipple discharge, no breast masses CV: no CP, palpitations, + intermittent LE edema,  GI: no heartburn, n/v/d/c, abd pain GU: no dysuria, urgency, frequency, hematuria; no vaginal itching, odor, discharge MSK: no joint pain, myalgias, back pain Neuro: no dizziness, headache, weakness, vertigo Psych: no depression, anxiety, insomnia   No Known Allergies  BP 126/86 (BP Location: Left Arm,  Patient Position: Sitting, Cuff Size: Normal)   Pulse 77   Temp (!) 97.2 F (36.2 C) (Temporal)   Ht 5\' 1"  (1.549 m)   Wt 171 lb (77.6 kg)   LMP 03/22/2021 (Exact Date)   SpO2 98%   BMI 32.31 kg/m  Wt Readings from Last 3 Encounters:  04/27/21 171 lb (77.6 kg)  04/21/20 172 lb 12.8 oz (78.4 kg)  02/10/20 170 lb (77.1 kg)   Temp Readings from Last 3 Encounters:  04/27/21 (!) 97.2 F (36.2 C) (Temporal)  04/21/20 97.8 F (36.6 C) (Temporal)  02/10/20 (!) 96.8 F (36 C) (Temporal)   BP Readings from Last 3 Encounters:  04/27/21 126/86  04/21/20 (!) 165/110  02/10/20 (!) 140/98   Pulse Readings from Last 3 Encounters:  04/27/21 77  04/21/20 95  02/10/20 86     Physical Exam Constitutional:      General: She is not in acute distress.    Appearance: She is well-developed.  HENT:     Head: Normocephalic and atraumatic.     Right Ear: Tympanic membrane and ear canal normal.     Left Ear: Tympanic membrane and ear canal normal.     Nose: Nose normal.     Mouth/Throat:     Mouth: Mucous membranes are moist.     Pharynx: Oropharynx is clear.  Eyes:     Conjunctiva/sclera: Conjunctivae normal.  Neck:     Thyroid: No thyromegaly.  Cardiovascular:     Rate and Rhythm: Normal rate and regular rhythm.     Pulses: Normal pulses.  Pulmonary:     Effort: Pulmonary effort is normal. No respiratory distress.     Breath sounds: Normal breath sounds. No wheezing or rhonchi.  Abdominal:     General: Bowel sounds are normal. There is no distension.     Palpations: Abdomen is soft. There is no mass.     Tenderness: There is no abdominal tenderness.  Musculoskeletal:     Cervical back: Neck supple.     Right lower leg: No edema.     Left lower leg: No edema.  Lymphadenopathy:     Cervical: No cervical adenopathy.  Skin:    General: Skin is warm and dry.  Neurological:     Mental Status: She is alert and oriented to person, place, and time.     Motor: No abnormal muscle  tone.     Coordination: Coordination normal.  Psychiatric:        Mood and Affect: Mood normal.        Behavior: Behavior normal.     A/P:  1. Annual physical exam - discussed importance of regular CV exercise, healthy diet, adequate sleep - breast exam done today, PAP due in 04/2022 - dental exam UTD and vision appt scheduled - immunizations UTD - pt is confident  she had Tdap in past 10 years - labs today per orders - next CPE in 1 year  2. Essential hypertension - controlled, at goal - Basic metabolic panel - CBC Refill: - losartan-hydrochlorothiazide (HYZAAR) 50-12.5 MG tablet; Take 1 tablet by mouth daily.  Dispense: 90 tablet; Refill: 3  3. Vitamin D deficiency - takes 2000IU daily - VITAMIN D 25 Hydroxy (Vit-D Deficiency, Fractures)  4. Hyperlipidemia, unspecified hyperlipidemia type - Lipid panel - AST - ALT Refill: - simvastatin (ZOCOR) 40 MG tablet; Take 1 tablet (40 mg total) by mouth every evening.  Dispense: 90 tablet; Refill: 3  5. Bilateral lower extremity edema - still intermittent Refill: - furosemide (LASIX) 20 MG tablet; 1/2 to 1 tab po daily PRN  Dispense: 30 tablet; Refill: 1     This visit occurred during the SARS-CoV-2 public health emergency.  Safety protocols were in place, including screening questions prior to the visit, additional usage of staff PPE, and extensive cleaning of exam room while observing appropriate contact time as indicated for disinfecting solutions.

## 2021-04-28 LAB — ALT: ALT: 11 IU/L (ref 0–32)

## 2021-04-28 LAB — LIPID PANEL
Chol/HDL Ratio: 4.7 ratio — ABNORMAL HIGH (ref 0.0–4.4)
Cholesterol, Total: 201 mg/dL — ABNORMAL HIGH (ref 100–199)
HDL: 43 mg/dL (ref >39)
LDL Chol Calc (NIH): 138 mg/dL — ABNORMAL HIGH (ref 0–99)
Triglycerides: 109 mg/dL (ref 0–149)
VLDL Cholesterol Cal: 20 mg/dL (ref 5–40)

## 2021-04-28 LAB — CBC
Hematocrit: 36.2 % (ref 34.0–46.6)
Hemoglobin: 12.3 g/dL (ref 11.1–15.9)
MCH: 28 pg (ref 26.6–33.0)
MCHC: 34 g/dL (ref 31.5–35.7)
MCV: 82 fL (ref 79–97)
Platelets: 311 10*3/uL (ref 150–450)
RBC: 4.4 x10E6/uL (ref 3.77–5.28)
RDW: 12.8 % (ref 11.7–15.4)
WBC: 8.7 10*3/uL (ref 3.4–10.8)

## 2021-04-28 LAB — BASIC METABOLIC PANEL
BUN/Creatinine Ratio: 16 (ref 9–23)
BUN: 12 mg/dL (ref 6–20)
CO2: 23 mmol/L (ref 20–29)
Calcium: 9.5 mg/dL (ref 8.7–10.2)
Chloride: 101 mmol/L (ref 96–106)
Creatinine, Ser: 0.74 mg/dL (ref 0.57–1.00)
Glucose: 97 mg/dL (ref 65–99)
Potassium: 3.8 mmol/L (ref 3.5–5.2)
Sodium: 139 mmol/L (ref 134–144)
eGFR: 105 mL/min/{1.73_m2} (ref >59)

## 2021-04-28 LAB — HEPATITIS B SURFACE ANTIGEN: Hepatitis B Surface Ag: NEGATIVE

## 2021-04-28 LAB — RPR: RPR Ser Ql: NONREACTIVE

## 2021-04-28 LAB — HIV ANTIBODY (ROUTINE TESTING W REFLEX): HIV Screen 4th Generation wRfx: NONREACTIVE

## 2021-04-28 LAB — HEPATITIS C ANTIBODY: Hep C Virus Ab: 0.2 s/co ratio (ref 0.0–0.9)

## 2021-04-28 LAB — AST: AST: 11 IU/L (ref 0–40)

## 2021-04-28 LAB — VITAMIN D 25 HYDROXY (VIT D DEFICIENCY, FRACTURES): Vit D, 25-Hydroxy: 55.8 ng/mL (ref 30.0–100.0)

## 2021-04-30 LAB — URINE CYTOLOGY ANCILLARY ONLY
Chlamydia: NEGATIVE
Comment: NEGATIVE
Comment: NEGATIVE
Comment: NORMAL
Neisseria Gonorrhea: NEGATIVE
Trichomonas: NEGATIVE

## 2021-05-01 DIAGNOSIS — Z20822 Contact with and (suspected) exposure to covid-19: Secondary | ICD-10-CM | POA: Diagnosis not present

## 2021-05-07 ENCOUNTER — Other Ambulatory Visit (HOSPITAL_COMMUNITY): Payer: Self-pay

## 2021-05-08 ENCOUNTER — Other Ambulatory Visit: Payer: Self-pay | Admitting: Physician Assistant

## 2021-05-08 DIAGNOSIS — U071 COVID-19: Secondary | ICD-10-CM

## 2021-05-30 ENCOUNTER — Other Ambulatory Visit (HOSPITAL_COMMUNITY): Payer: Self-pay

## 2021-06-02 ENCOUNTER — Other Ambulatory Visit (HOSPITAL_COMMUNITY): Payer: Self-pay

## 2021-06-05 DIAGNOSIS — H52223 Regular astigmatism, bilateral: Secondary | ICD-10-CM | POA: Diagnosis not present

## 2021-06-06 ENCOUNTER — Other Ambulatory Visit: Payer: Self-pay | Admitting: Family Medicine

## 2021-06-06 ENCOUNTER — Other Ambulatory Visit (HOSPITAL_COMMUNITY): Payer: Self-pay

## 2021-06-06 MED ORDER — SIMVASTATIN 40 MG PO TABS
40.0000 mg | ORAL_TABLET | Freq: Every evening | ORAL | 1 refills | Status: DC
  Filled 2021-06-06: qty 90, 90d supply, fill #0
  Filled 2021-09-11: qty 90, 90d supply, fill #1

## 2021-07-19 ENCOUNTER — Other Ambulatory Visit (HOSPITAL_COMMUNITY): Payer: Self-pay

## 2021-08-08 ENCOUNTER — Encounter: Payer: Self-pay | Admitting: Nurse Practitioner

## 2021-08-08 ENCOUNTER — Other Ambulatory Visit (HOSPITAL_COMMUNITY): Payer: Self-pay

## 2021-08-08 ENCOUNTER — Ambulatory Visit: Payer: 59 | Admitting: Nurse Practitioner

## 2021-08-08 ENCOUNTER — Other Ambulatory Visit: Payer: Self-pay

## 2021-08-08 VITALS — BP 128/76 | HR 80 | Temp 97.9°F | Ht 61.5 in | Wt 172.0 lb

## 2021-08-08 DIAGNOSIS — H6502 Acute serous otitis media, left ear: Secondary | ICD-10-CM | POA: Insufficient documentation

## 2021-08-08 MED ORDER — AMOXICILLIN-POT CLAVULANATE 875-125 MG PO TABS
1.0000 | ORAL_TABLET | Freq: Two times a day (BID) | ORAL | 0 refills | Status: DC
  Filled 2021-08-08: qty 14, 7d supply, fill #0

## 2021-08-08 NOTE — Progress Notes (Signed)
Acute Office Visit  Subjective:    Patient ID: Tina Cooper, female    DOB: Jun 27, 1981, 40 y.o.   MRN: [id number]  Chief Complaint  Patient presents with   Otitis Media    Left     HPI Patient is in today for Ear pain   Symptoms started approx 1 weeks ago  States she flew and when she returned started having fever and chills. That has resolved  Has been taking tylenol and ibuprofen  States that she did a virtual visit placed amoxicillin and higher strength of ibuprofen. Had some improvement with pain but pressure still the same States she has been using Flonase with no added relief  Past Medical History:  Diagnosis Date   Anemia    Hyperlipidemia     Past Surgical History:  Procedure Laterality Date   WISDOM TOOTH EXTRACTION      Family History  Problem Relation Age of Onset   Diabetes Mother    Hypertension Mother    Hyperlipidemia Father    Hypertension Father     Social History   Socioeconomic History   Marital status: Single    Spouse name: Not on file   Number of children: Not on file   Years of education: Not on file   Highest education level: Not on file  Occupational History   Occupation: Therapist, sports    Employer: [employer]  Tobacco Use   Smoking status: Never   Smokeless tobacco: Never  Substance and Sexual Activity   Alcohol use: Yes    Alcohol/week: 0.0 standard drinks    Comment: occasional   Drug use: No   Sexual activity: Yes    Birth control/protection: None  Other Topics Concern   Not on file  Social History Narrative   Not on file   Social Determinants of Health   Financial Resource Strain: Not on file  Food Insecurity: Not on file  Transportation Needs: Not on file  Physical Activity: Not on file  Stress: Not on file  Social Connections: Not on file  Intimate Partner Violence: Not on file    Outpatient Medications Prior to Visit  Medication Sig Dispense Refill   amoxicillin (AMOXIL) 500 MG capsule Take 500 mg by mouth  every 12 (twelve) hours.     b complex vitamins capsule Take 1 capsule by mouth daily. 30 capsule 3   docusate sodium (COLACE) 100 MG capsule Take 100 mg by mouth 2 (two) times daily.     ferrous sulfate 325 (65 FE) MG tablet Take 325 mg by mouth daily with breakfast.     fluticasone (FLONASE) 50 MCG/ACT nasal spray PLACE 2 SPRAYS IN EACH NOSTRIL ONCE A DAY 16 g 5   furosemide (LASIX) 20 MG tablet Take 1/2 to 1 tablet by mouth once daily as needed. 30 tablet 1   ibuprofen (ADVIL) 800 MG tablet Take 800 mg by mouth 3 (three) times daily as needed.     losartan-hydrochlorothiazide (HYZAAR) 50-12.5 MG tablet Take 1 tablet by mouth daily. 90 tablet 3   simvastatin (ZOCOR) 40 MG tablet Take 1 tablet (40 mg total) by mouth every evening. 90 tablet 1   Blood Pressure Monitoring (OMRON 3 SERIES BP MONITOR) DEVI Use as directed (Patient not taking: No sig reported) 1 each 0   simvastatin (ZOCOR) 40 MG tablet Take 1 tablet (40 mg total) by mouth every evening. 90 tablet 3   No facility-administered medications prior to visit.    No Known Allergies  Review of Systems  Constitutional:  Negative for chills, fatigue and fever.  HENT:  Positive for ear pain and hearing loss. Negative for congestion, rhinorrhea and sore throat.   Respiratory:  Positive for cough (clear/yellow. Small amount that is thick). Negative for shortness of breath.   Cardiovascular:  Negative for chest pain.  Neurological:  Positive for light-headedness. Negative for dizziness and headaches.      Objective:    Physical Exam Vitals and nursing note reviewed.  Constitutional:      Appearance: Normal appearance.  HENT:     Right Ear: Tympanic membrane, ear canal and external ear normal. There is no impacted cerumen.     Left Ear: Ear canal and external ear normal. There is no impacted cerumen.     Mouth/Throat:     Mouth: Mucous membranes are moist.  Cardiovascular:     Rate and Rhythm: Normal rate and regular rhythm.   Pulmonary:     Effort: Pulmonary effort is normal.     Breath sounds: Normal breath sounds.  Lymphadenopathy:     Cervical: Cervical adenopathy present.  Neurological:     Mental Status: She is alert.  Psychiatric:        Mood and Affect: Mood normal.        Behavior: Behavior normal.        Thought Content: Thought content normal.        Judgment: Judgment normal.    BP 128/76   Pulse 80   Temp 97.9 F (36.6 C) (Temporal)   Ht 5' 1.5" (1.562 m)   Wt 172 lb (78 kg)   SpO2 96%   BMI 31.97 kg/m  Wt Readings from Last 3 Encounters:  08/08/21 172 lb (78 kg)  04/27/21 171 lb (77.6 kg)  04/21/20 172 lb 12.8 oz (78.4 kg)    Health Maintenance Due  Topic Date Due   COVID-19 Vaccine (1) Never done   TETANUS/TDAP  08/29/2015   INFLUENZA VACCINE  05/07/2021    There are no preventive care reminders to display for this patient.   Lab Results  Component Value Date   TSH 1.38 04/21/2019   Lab Results  Component Value Date   WBC 8.7 04/27/2021   HGB 12.3 04/27/2021   HCT 36.2 04/27/2021   MCV 82 04/27/2021   PLT 311 04/27/2021   Lab Results  Component Value Date   NA 139 04/27/2021   K 3.8 04/27/2021   CHLORIDE 108 02/16/2016   CO2 23 04/27/2021   GLUCOSE 97 04/27/2021   BUN 12 04/27/2021   CREATININE 0.74 04/27/2021   BILITOT 0.4 04/21/2019   ALKPHOS 48 04/21/2019   AST 11 04/27/2021   ALT 11 04/27/2021   PROT 7.2 04/21/2019   ALBUMIN 4.6 04/21/2019   CALCIUM 9.5 04/27/2021   ANIONGAP 9 02/16/2016   EGFR 105 04/27/2021   GFR 99.67 02/16/2020   Lab Results  Component Value Date   CHOL 201 (H) 04/27/2021   Lab Results  Component Value Date   HDL 43 04/27/2021   Lab Results  Component Value Date   LDLCALC 138 (H) 04/27/2021   Lab Results  Component Value Date   TRIG 109 04/27/2021   Lab Results  Component Value Date   CHOLHDL 4.7 (H) 04/27/2021   No results found for: HGBA1C     Assessment & Plan:   Problem List Items Addressed This  Visit       Nervous and Auditory   Non-recurrent acute serous otitis  media of left ear - Primary    Patient with left otitis media.  Was seen virtually and prescribed amoxicillin 500 mg twice daily is on day 8 of amoxicillin with very minimal relief.  Will change patient's antibiotic to Augmentin start with 7-day course May extend to 10 days if needed if not we will have to extend coverage and further.  Patient may continue using over-the-counter medications to help with symptoms along with ibuprofen that was prescribed via virtual visit.  She will keep [place] updated if not improving      Relevant Medications   amoxicillin-clavulanate (AUGMENTIN) 875-125 MG tablet     No orders of the defined types were placed in this encounter.  This visit occurred during the SARS-CoV-2 public health emergency.  Safety protocols were in place, including screening questions prior to the visit, additional usage of staff PPE, and extensive cleaning of exam room while observing appropriate contact time as indicated for disinfecting solutions.   Romilda Garret, NP

## 2021-08-08 NOTE — Patient Instructions (Signed)
Nice to see you today STOP the amoxicillin and start the Augmentin. We will start will 7 days but can extend to 10 days if needed Let me know if you are not beginning to improve in approx 3 days.

## 2021-08-08 NOTE — Assessment & Plan Note (Signed)
Patient with left otitis media.  Was seen virtually and prescribed amoxicillin 500 mg twice daily is on day 8 of amoxicillin with very minimal relief.  Will change patient's antibiotic to Augmentin start with 7-day course May extend to 10 days if needed if not we will have to extend coverage and further.  Patient may continue using over-the-counter medications to help with symptoms along with ibuprofen that was prescribed via virtual visit.  She will keep [place] updated if not improving

## 2021-08-14 ENCOUNTER — Encounter: Payer: Self-pay | Admitting: Nurse Practitioner

## 2021-08-14 ENCOUNTER — Other Ambulatory Visit (HOSPITAL_COMMUNITY): Payer: Self-pay

## 2021-08-14 MED ORDER — AMOXICILLIN-POT CLAVULANATE 875-125 MG PO TABS
1.0000 | ORAL_TABLET | Freq: Two times a day (BID) | ORAL | 0 refills | Status: AC
  Filled 2021-08-14: qty 6, 3d supply, fill #0

## 2021-08-16 ENCOUNTER — Other Ambulatory Visit (HOSPITAL_COMMUNITY): Payer: Self-pay

## 2021-08-20 ENCOUNTER — Other Ambulatory Visit: Payer: Self-pay | Admitting: Nurse Practitioner

## 2021-08-20 DIAGNOSIS — H6502 Acute serous otitis media, left ear: Secondary | ICD-10-CM

## 2021-09-11 ENCOUNTER — Other Ambulatory Visit (HOSPITAL_COMMUNITY): Payer: Self-pay

## 2021-09-20 DIAGNOSIS — H7202 Central perforation of tympanic membrane, left ear: Secondary | ICD-10-CM | POA: Diagnosis not present

## 2021-09-20 DIAGNOSIS — H903 Sensorineural hearing loss, bilateral: Secondary | ICD-10-CM | POA: Diagnosis not present

## 2021-10-17 ENCOUNTER — Other Ambulatory Visit (HOSPITAL_COMMUNITY): Payer: Self-pay

## 2021-10-17 MED ORDER — CARESTART COVID-19 HOME TEST VI KIT
PACK | 0 refills | Status: DC
  Filled 2021-10-17: qty 4, 4d supply, fill #0

## 2021-10-20 ENCOUNTER — Other Ambulatory Visit (HOSPITAL_COMMUNITY): Payer: Self-pay

## 2021-10-30 ENCOUNTER — Other Ambulatory Visit (HOSPITAL_COMMUNITY): Payer: Self-pay

## 2021-10-31 ENCOUNTER — Other Ambulatory Visit (HOSPITAL_COMMUNITY): Payer: Self-pay

## 2021-12-11 ENCOUNTER — Encounter: Payer: Self-pay | Admitting: Nurse Practitioner

## 2021-12-17 ENCOUNTER — Other Ambulatory Visit: Payer: Self-pay | Admitting: Family

## 2021-12-18 ENCOUNTER — Other Ambulatory Visit (HOSPITAL_COMMUNITY): Payer: Self-pay

## 2021-12-19 ENCOUNTER — Other Ambulatory Visit (HOSPITAL_COMMUNITY): Payer: Self-pay

## 2021-12-21 ENCOUNTER — Other Ambulatory Visit: Payer: Self-pay

## 2021-12-21 ENCOUNTER — Ambulatory Visit (INDEPENDENT_AMBULATORY_CARE_PROVIDER_SITE_OTHER): Payer: 59 | Admitting: Nurse Practitioner

## 2021-12-21 ENCOUNTER — Other Ambulatory Visit (HOSPITAL_COMMUNITY)
Admission: RE | Admit: 2021-12-21 | Discharge: 2021-12-21 | Disposition: A | Discharge status: Home / Self Care | Payer: 59 | Source: Ambulatory Visit | Attending: Nurse Practitioner | Admitting: Nurse Practitioner

## 2021-12-21 ENCOUNTER — Telehealth: Payer: Self-pay | Admitting: Nurse Practitioner

## 2021-12-21 ENCOUNTER — Encounter: Payer: Self-pay | Admitting: Nurse Practitioner

## 2021-12-21 ENCOUNTER — Other Ambulatory Visit (HOSPITAL_COMMUNITY): Payer: Self-pay

## 2021-12-21 VITALS — BP 124/80 | HR 74 | Temp 98.0°F | Ht 62.0 in | Wt 176.2 lb

## 2021-12-21 DIAGNOSIS — D500 Iron deficiency anemia secondary to blood loss (chronic): Secondary | ICD-10-CM | POA: Diagnosis not present

## 2021-12-21 DIAGNOSIS — Z124 Encounter for screening for malignant neoplasm of cervix: Secondary | ICD-10-CM

## 2021-12-21 DIAGNOSIS — I10 Essential (primary) hypertension: Secondary | ICD-10-CM | POA: Diagnosis not present

## 2021-12-21 DIAGNOSIS — Z113 Encounter for screening for infections with a predominantly sexual mode of transmission: Secondary | ICD-10-CM

## 2021-12-21 DIAGNOSIS — N926 Irregular menstruation, unspecified: Secondary | ICD-10-CM

## 2021-12-21 DIAGNOSIS — Z1231 Encounter for screening mammogram for malignant neoplasm of breast: Secondary | ICD-10-CM | POA: Diagnosis not present

## 2021-12-21 DIAGNOSIS — M25552 Pain in left hip: Secondary | ICD-10-CM | POA: Diagnosis not present

## 2021-12-21 DIAGNOSIS — G8929 Other chronic pain: Secondary | ICD-10-CM

## 2021-12-21 DIAGNOSIS — Z0001 Encounter for general adult medical examination with abnormal findings: Secondary | ICD-10-CM | POA: Insufficient documentation

## 2021-12-21 DIAGNOSIS — E559 Vitamin D deficiency, unspecified: Secondary | ICD-10-CM

## 2021-12-21 DIAGNOSIS — N889 Noninflammatory disorder of cervix uteri, unspecified: Secondary | ICD-10-CM

## 2021-12-21 DIAGNOSIS — E785 Hyperlipidemia, unspecified: Secondary | ICD-10-CM

## 2021-12-21 LAB — CBC
HCT: 40.3 % (ref 36.0–46.0)
Hemoglobin: 13.3 g/dL (ref 12.0–15.0)
MCHC: 33 g/dL (ref 30.0–36.0)
MCV: 82.9 fl (ref 78.0–100.0)
Platelets: 372 10*3/uL (ref 150.0–400.0)
RBC: 4.86 Mil/uL (ref 3.87–5.11)
RDW: 14.1 % (ref 11.5–15.5)
WBC: 10.2 10*3/uL (ref 4.0–10.5)

## 2021-12-21 LAB — VITAMIN D 25 HYDROXY (VIT D DEFICIENCY, FRACTURES): VITD: 45.65 ng/mL (ref 30.00–100.00)

## 2021-12-21 LAB — COMPREHENSIVE METABOLIC PANEL
ALT: 11 U/L (ref 0–35)
AST: 14 U/L (ref 0–37)
Albumin: 4.8 g/dL (ref 3.5–5.2)
Alkaline Phosphatase: 50 U/L (ref 39–117)
BUN: 14 mg/dL (ref 6–23)
CO2: 25 mEq/L (ref 19–32)
Calcium: 10.2 mg/dL (ref 8.4–10.5)
Chloride: 100 mEq/L (ref 96–112)
Creatinine, Ser: 0.89 mg/dL (ref 0.40–1.20)
GFR: 81.06 mL/min (ref >60.00)
Glucose, Bld: 92 mg/dL (ref 70–99)
Potassium: 3.6 mEq/L (ref 3.5–5.1)
Sodium: 134 mEq/L — ABNORMAL LOW (ref 135–145)
Total Bilirubin: 0.3 mg/dL (ref 0.2–1.2)
Total Protein: 8 g/dL (ref 6.0–8.3)

## 2021-12-21 LAB — LIPID PANEL
Cholesterol: 247 mg/dL — ABNORMAL HIGH (ref 0–200)
HDL: 54.8 mg/dL (ref >39.00)
LDL Cholesterol: 166 mg/dL — ABNORMAL HIGH (ref 0–99)
NonHDL: 192.26
Total CHOL/HDL Ratio: 5
Triglycerides: 130 mg/dL (ref 0.0–149.0)
VLDL: 26 mg/dL (ref 0.0–40.0)

## 2021-12-21 MED ORDER — LOSARTAN POTASSIUM-HCTZ 50-12.5 MG PO TABS
1.0000 | ORAL_TABLET | Freq: Every day | ORAL | 3 refills | Status: DC
  Filled 2021-12-21 – 2022-02-06 (×2): qty 90, 90d supply, fill #0
  Filled 2022-05-06: qty 90, 90d supply, fill #1
  Filled 2022-08-03: qty 90, 90d supply, fill #2
  Filled 2022-11-15 – 2022-11-16 (×2): qty 90, 90d supply, fill #3

## 2021-12-21 MED ORDER — MELOXICAM 7.5 MG PO TABS
7.5000 mg | ORAL_TABLET | Freq: Every day | ORAL | 2 refills | Status: DC
  Filled 2021-12-21: qty 30, 30d supply, fill #0
  Filled 2022-05-15: qty 30, 30d supply, fill #1
  Filled 2022-10-09: qty 30, 30d supply, fill #2

## 2021-12-21 NOTE — Telephone Encounter (Signed)
Pt said Claris Gower is going to refer her to a obgyn. Pt prefers Mitchel Honour, DO at [facility] if she is not taking new pts then pt would like Pryor Ochoa, DO at [facility].  ?

## 2021-12-21 NOTE — Assessment & Plan Note (Signed)
BP at goal with losartan/hctz ?BP Readings from Last 3 Encounters:  ?12/21/21 124/80  ?08/08/21 128/76  ?04/27/21 126/86  ? ?Check CMP ?Refill sent ?

## 2021-12-21 NOTE — Assessment & Plan Note (Signed)
Intermittent ?Request for mobic rx, sent ?Advised to avoid any pther NSAID while using mobic ?

## 2021-12-21 NOTE — Assessment & Plan Note (Signed)
Cervical os ?PAP smear collected ?Entered referral to GYN ?

## 2021-12-21 NOTE — Patient Instructions (Addendum)
Go to lab for blood draw ?Start heart healthy diet and daily exercise ?You will be contacted to schedule appt with GYN and for mammogram. ?Do not take any other NSAIDs while using mobic ? ?Preventive Care 40-41 Years Old, Female ?Preventive care refers to lifestyle choices and visits with your health care provider that can promote health and wellness. Preventive care visits are also called wellness exams. ?What can I expect for my preventive care visit? ?Counseling ?Your health care provider may ask you questions about your: ?Medical history, including: ?Past medical problems. ?Family medical history. ?Pregnancy history. ?Current health, including: ?Menstrual cycle. ?Method of birth control. ?Emotional well-being. ?Home life and relationship well-being. ?Sexual activity and sexual health. ?Lifestyle, including: ?Alcohol, nicotine or tobacco, and drug use. ?Access to firearms. ?Diet, exercise, and sleep habits. ?Work and work environment. ?Sunscreen use. ?Safety issues such as seatbelt and bike helmet use. ?Physical exam ?Your health care provider will check your: ?Height and weight. These may be used to calculate your BMI (body mass index). BMI is a measurement that tells if you are at a healthy weight. ?Waist circumference. This measures the distance around your waistline. This measurement also tells if you are at a healthy weight and may help predict your risk of certain diseases, such as type 2 diabetes and high blood pressure. ?Heart rate and blood pressure. ?Body temperature. ?Skin for abnormal spots. ?What immunizations do I need? ?Vaccines are usually given at various ages, according to a schedule. Your health care provider will recommend vaccines for you based on your age, medical history, and lifestyle or other factors, such as travel or where you work. ?What tests do I need? ?Screening ?Your health care provider may recommend screening tests for certain conditions. This may include: ?Lipid and cholesterol  levels. ?Diabetes screening. This is done by checking your blood sugar (glucose) after you have not eaten for a while (fasting). ?Pelvic exam and Pap test. ?Hepatitis B test. ?Hepatitis C test. ?HIV (human immunodeficiency virus) test. ?STI (sexually transmitted infection) testing, if you are at risk. ?Lung cancer screening. ?Colorectal cancer screening. ?Mammogram. Talk with your health care provider about when you should start having regular mammograms. This may depend on whether you have a family history of breast cancer. ?BRCA-related cancer screening. This may be done if you have a family history of breast, ovarian, tubal, or peritoneal cancers. ?Bone density scan. This is done to screen for osteoporosis. ?Talk with your health care provider about your test results, treatment options, and if necessary, the need for more tests. ?Follow these instructions at home: ?Eating and drinking ? ?Eat a diet that includes fresh fruits and vegetables, whole grains, lean protein, and low-fat dairy products. ?Take vitamin and mineral supplements as recommended by your health care provider. ?Do not drink alcohol if: ?Your health care provider tells you not to drink. ?You are pregnant, may be pregnant, or are planning to become pregnant. ?If you drink alcohol: ?Limit how much you have to 0-1 drink a day. ?Know how much alcohol is in your drink. In the [place] one drink equals one 12 oz bottle of beer (355 mL), one 5 oz glass of wine (148 mL), or one 1? oz glass of hard liquor (44 mL). ?Lifestyle ?Brush your teeth every morning and night with fluoride toothpaste. Floss one time each day. ?Exercise for at least 30 minutes 5 or more days each week. ?Do not use any products that contain nicotine or tobacco. These products include cigarettes, chewing tobacco, and vaping devices,   such as e-cigarettes. If you need help quitting, ask your health care provider. ?Do not use drugs. ?If you are sexually active, practice safe sex. Use a  condom or other form of protection to prevent STIs. ?If you do not wish to become pregnant, use a form of birth control. If you plan to become pregnant, see your health care provider for a prepregnancy visit. ?Take aspirin only as told by your health care provider. Make sure that you understand how much to take and what form to take. Work with your health care provider to find out whether it is safe and beneficial for you to take aspirin daily. ?Find healthy ways to manage stress, such as: ?Meditation, yoga, or listening to music. ?Journaling. ?Talking to a trusted person. ?Spending time with friends and family. ?Minimize exposure to UV radiation to reduce your risk of skin cancer. ?Safety ?Always wear your seat belt while driving or riding in a vehicle. ?Do not drive: ?If you have been drinking alcohol. Do not ride with someone who has been drinking. ?When you are tired or distracted. ?While texting. ?If you have been using any mind-altering substances or drugs. ?Wear a helmet and other protective equipment during sports activities. ?If you have firearms in your house, make sure you follow all gun safety procedures. ?Seek help if you have been physically or sexually abused. ?What's next? ?Visit your health care provider once a year for an annual wellness visit. ?Ask your health care provider how often you should have your eyes and teeth checked. ?Stay up to date on all vaccines. ?This information is not intended to replace advice given to you by your health care provider. Make sure you discuss any questions you have with your health care provider. ?Document Revised: 03/21/2021 Document Reviewed: 03/21/2021 ?Elsevier Patient Education ? 2022 [employer] ? ? ?

## 2021-12-21 NOTE — Progress Notes (Signed)
? ?Complete physical exam ? ?Patient: Tina Cooper   DOB: 01-Sep-1981   41 y.o. Female  MRN: [id number] ?Visit Date: 12/21/2021 ? ?Subjective:  ?  ?Chief Complaint  ?Patient presents with  ? Annual Exam  ?  Physical-Breast, pap and STD screen needed.  ?Pt is not fasting.  ?Pt states she has had tdap vaccine within 10 years ?Pt has not had a eye exam.   ? ?Tina Cooper is a 41 y.o. female who presents today for a complete physical exam. She reports consuming a general diet.  No exercise regimen  She generally feels well. She reports sleeping well. She does have additional problems to discuss today.  ?Vision:Yes ?Dental:Yes ?STD Screen:Yes ? ?Wt Readings from Last 3 Encounters:  ?12/21/21 176 lb 3.2 oz (79.9 kg)  ?08/08/21 172 lb (78 kg)  ?04/27/21 171 lb (77.6 kg)  ?  ?Most recent depression screenings: ?PHQ 2/9 Scores 12/21/2021 04/27/2021  ?PHQ - 2 Score 0 1  ?PHQ- 9 Score - 2  ? ?HPI  ?Chronic left hip pain ?Intermittent ?Request for mobic rx, sent ?Advised to avoid any pther NSAID while using mobic ? ?Cervical lesion ?Cervical os ?PAP smear collected ?Entered referral to GYN ? ?Vitamin D deficiency ?Repeat Vit. D ? ?Essential hypertension ?BP at goal with losartan/hctz ?BP Readings from Last 3 Encounters:  ?12/21/21 124/80  ?08/08/21 128/76  ?04/27/21 126/86  ? ?Check CMP ?Refill sent ? ?Iron deficiency anemia due to chronic blood loss ?Repeat cbc and iron panel ?Current use of MVI and iron supplement ? ?Past Medical History:  ?Diagnosis Date  ? Anemia   ? Hyperlipidemia   ? ?Past Surgical History:  ?Procedure Laterality Date  ? WISDOM TOOTH EXTRACTION    ? ?Social History  ? ?Socioeconomic History  ? Marital status: Single  ?  Spouse name: Not on file  ? Number of children: Not on file  ? Years of education: Not on file  ? Highest education level: Not on file  ?Occupational History  ? Occupation: Therapist, sports  ?  Employer: [employer]  ?Tobacco Use  ? Smoking status: Never  ? Smokeless tobacco: Never  ?Vaping Use  ? Vaping  Use: Never used  ?Substance and Sexual Activity  ? Alcohol use: Yes  ?  Alcohol/week: 0.0 standard drinks  ?  Comment: occasional  ? Drug use: No  ? Sexual activity: Yes  ?  Birth control/protection: None  ?Other Topics Concern  ? Not on file  ?Social History Narrative  ? Not on file  ? ?Social Determinants of Health  ? ?Financial Resource Strain: Not on file  ?Food Insecurity: Not on file  ?Transportation Needs: Not on file  ?Physical Activity: Not on file  ?Stress: Not on file  ?Social Connections: Not on file  ?Intimate Partner Violence: Not on file  ? ?Family Status  ?Relation Name Status  ? Mother  Alive  ? Father  Alive  ? ?Family History  ?Problem Relation Age of Onset  ? Diabetes Mother   ? Hypertension Mother   ? Hyperlipidemia Father   ? Hypertension Father   ? ?No Known Allergies  ?Patient Care Team: ?Coleson Kant, Charlene Brooke, NP as PCP - General (Internal Medicine)  ? ?Medications: ?Outpatient Medications Prior to Visit  ?Medication Sig  ? b complex vitamins capsule Take 1 capsule by mouth daily.  ? docusate sodium (COLACE) 100 MG capsule Take 100 mg by mouth 2 (two) times daily.  ? ferrous sulfate 325 (65 FE) MG tablet  Take 325 mg by mouth daily with breakfast.  ? fluticasone (FLONASE) 50 MCG/ACT nasal spray PLACE 2 SPRAYS IN EACH NOSTRIL ONCE A DAY  ? furosemide (LASIX) 20 MG tablet Take 1/2 to 1 tablet by mouth once daily as needed.  ? simvastatin (ZOCOR) 40 MG tablet Take 1 tablet (40 mg total) by mouth every evening.  ? VITAMIN D PO   ? [DISCONTINUED] fluticasone (FLONASE) 50 MCG/ACT nasal spray   ? [DISCONTINUED] ibuprofen (ADVIL) 800 MG tablet Take 800 mg by mouth 3 (three) times daily as needed.  ? [DISCONTINUED] losartan-hydrochlorothiazide (HYZAAR) 50-12.5 MG tablet Take 1 tablet by mouth daily.  ? [DISCONTINUED] Blood Pressure Monitoring (OMRON 3 SERIES BP MONITOR) DEVI Use as directed (Patient not taking: Reported on 04/27/2021)  ? [DISCONTINUED] COVID-19 At Home Antigen Test [facility] COVID-19  HOME TEST) KIT Use as Directed within package instructions. (Patient not taking: Reported on 12/21/2021)  ? [DISCONTINUED] Losartan Potassium-HCTZ (HYZAAR PO)  (Patient not taking: Reported on 12/21/2021)  ? ?No facility-administered medications prior to visit.  ? ?Review of Systems  ?Constitutional:  Negative for fever.  ?HENT:  Negative for congestion and sore throat.   ?Eyes:   ?     Negative for visual changes  ?Respiratory:  Negative for cough and shortness of breath.   ?Cardiovascular:  Negative for chest pain, palpitations and leg swelling.  ?Gastrointestinal:  Negative for blood in stool, constipation and diarrhea.  ?Endocrine: Negative.   ?Genitourinary:  Positive for menstrual problem. Negative for dysuria, frequency and urgency.  ?Musculoskeletal:  Negative for myalgias.  ?Skin:  Negative for rash.  ?Neurological:  Negative for dizziness and headaches.  ?Hematological:  Does not bruise/bleed easily.  ?Psychiatric/Behavioral: Negative.  Negative for suicidal ideas. The patient is not nervous/anxious.   ? ?   ?Objective:  ?BP 124/80 (BP Location: Left Arm, Patient Position: Sitting, Cuff Size: Large)   Pulse 74   Temp 98 ?F (36.7 ?C) (Temporal)   Ht '5\' 2"'  (1.575 m)   Wt 176 lb 3.2 oz (79.9 kg)   SpO2 100%   BMI 32.23 kg/m?  ?  ? ? ? ?Physical Exam ?Vitals and nursing note reviewed. Exam conducted with a chaperone present.  ?Constitutional:   ?   General: She is not in acute distress. ?   Appearance: She is obese.  ?HENT:  ?   Right Ear: Tympanic membrane, ear canal and external ear normal.  ?   Left Ear: Tympanic membrane, ear canal and external ear normal.  ?Eyes:  ?   General: No scleral icterus. ?   Extraocular Movements: Extraocular movements intact.  ?   Conjunctiva/sclera: Conjunctivae normal.  ?Neck:  ?   Thyroid: No thyroid mass, thyromegaly or thyroid tenderness.  ?Cardiovascular:  ?   Rate and Rhythm: Normal rate and regular rhythm.  ?   Pulses: Normal pulses.  ?   Heart sounds: Normal heart  sounds.  ?Pulmonary:  ?   Effort: Pulmonary effort is normal. No respiratory distress.  ?   Breath sounds: Normal breath sounds.  ?Chest:  ?Breasts: ?   Right: Normal.  ?   Left: Normal.  ?Abdominal:  ?   General: Bowel sounds are normal. There is no distension.  ?   Palpations: Abdomen is soft.  ?   Hernia: There is no hernia in the left inguinal area or right inguinal area.  ?Genitourinary: ?   General: Normal vulva.  ?   Labia:     ?   Right: No  rash, tenderness or lesion.     ?   Left: No rash, tenderness or lesion.   ?   Urethra: No prolapse.  ?   Vagina: Normal. No vaginal discharge.  ?   Cervix: Friability and lesion present. No cervical motion tenderness, discharge or erythema.  ?   Uterus: Normal.   ?   Adnexa: Right adnexa normal and left adnexa normal.  ? ? ? ?   Comments: Hyperpigmented polyp in cervical os ?Musculoskeletal:     ?   General: Normal range of motion.  ?   Cervical back: Normal range of motion and neck supple.  ?   Right lower leg: No edema.  ?   Left lower leg: No edema.  ?Lymphadenopathy:  ?   Cervical: No cervical adenopathy.  ?   Upper Body:  ?   Right upper body: No supraclavicular, axillary or pectoral adenopathy.  ?   Left upper body: No supraclavicular, axillary or pectoral adenopathy.  ?   Lower Body: No right inguinal adenopathy. No left inguinal adenopathy.  ?Skin: ?   General: Skin is warm and dry.  ?Neurological:  ?   Mental Status: She is alert and oriented to person, place, and time.  ?Psychiatric:     ?   Mood and Affect: Mood normal.     ?   Behavior: Behavior normal.     ?   Thought Content: Thought content normal.  ?  ?No results found for any visits on 12/21/21. ?   ?Assessment & Plan:  ?  ?Routine Health Maintenance and Physical Exam ? ?Immunization History  ?Administered Date(s) Administered  ? Influenza Whole 07/21/2009  ? Influenza-Unspecified 07/07/2014, 07/07/2021  ? Td 08/28/2005  ? ? ?Health Maintenance  ?Topic Date Due  ? COVID-19 Vaccine (1) Never done  ?  TETANUS/TDAP  08/29/2015  ? PAP SMEAR-Modifier  04/20/2022  ? INFLUENZA VACCINE  Completed  ? Hepatitis C Screening  Completed  ? HIV Screening  Completed  ? HPV VACCINES  Aged Out  ? ?Discussed health benefits of phy

## 2021-12-21 NOTE — Assessment & Plan Note (Signed)
Repeat cbc and iron panel ?Current use of MVI and iron supplement ?

## 2021-12-21 NOTE — Assessment & Plan Note (Signed)
Repeat Vit. D 

## 2021-12-22 LAB — IRON,TIBC AND FERRITIN PANEL
%SAT: 10 % (calc) — ABNORMAL LOW (ref 16–45)
Ferritin: 10 ng/mL — ABNORMAL LOW (ref 16–154)
Iron: 43 ug/dL (ref 40–190)
TIBC: 430 mcg/dL (calc) (ref 250–450)

## 2021-12-24 LAB — RPR: RPR Ser Ql: NONREACTIVE

## 2021-12-24 LAB — CERVICOVAGINAL ANCILLARY ONLY
Chlamydia: NEGATIVE
Comment: NEGATIVE
Comment: NEGATIVE
Comment: NORMAL
Neisseria Gonorrhea: NEGATIVE
Trichomonas: NEGATIVE

## 2021-12-24 LAB — HEPATITIS C ANTIBODY
Hepatitis C Ab: NONREACTIVE
SIGNAL TO CUT-OFF: <0.02 (ref <1.00)

## 2021-12-24 LAB — CYTOLOGY - PAP
Comment: NEGATIVE
Diagnosis: NEGATIVE
High risk HPV: NEGATIVE

## 2021-12-24 LAB — HIV ANTIBODY (ROUTINE TESTING W REFLEX): HIV 1&2 Ab, 4th Generation: NONREACTIVE

## 2021-12-26 ENCOUNTER — Other Ambulatory Visit (HOSPITAL_COMMUNITY): Payer: Self-pay

## 2021-12-26 ENCOUNTER — Other Ambulatory Visit: Payer: Self-pay | Admitting: Family

## 2021-12-26 DIAGNOSIS — E7800 Pure hypercholesterolemia, unspecified: Secondary | ICD-10-CM

## 2021-12-27 ENCOUNTER — Other Ambulatory Visit (HOSPITAL_COMMUNITY): Payer: Self-pay

## 2021-12-27 MED ORDER — SIMVASTATIN 40 MG PO TABS
40.0000 mg | ORAL_TABLET | Freq: Every evening | ORAL | 3 refills | Status: DC
  Filled 2021-12-27: qty 90, 90d supply, fill #0
  Filled 2022-03-29: qty 90, 90d supply, fill #1
  Filled 2022-06-28: qty 90, 90d supply, fill #2
  Filled 2022-10-09: qty 90, 90d supply, fill #3

## 2021-12-27 NOTE — Telephone Encounter (Signed)
Pt called to follow up on this but it was sent to the wrong provider. Pt said she is out and asked if it can please be sent in. [facility] [employer]. Callback if needed is [phone] ?

## 2021-12-27 NOTE — Addendum Note (Signed)
Addended by: Alysia Penna L on: 12/27/2021 11:54 AM ? ? Modules accepted: Orders ? ?

## 2021-12-28 ENCOUNTER — Other Ambulatory Visit (HOSPITAL_COMMUNITY): Payer: Self-pay

## 2021-12-31 ENCOUNTER — Ambulatory Visit
Admission: RE | Admit: 2021-12-31 | Discharge: 2021-12-31 | Disposition: A | Discharge status: Home / Self Care | Payer: 59 | Source: Ambulatory Visit | Attending: Nurse Practitioner | Admitting: Nurse Practitioner

## 2021-12-31 DIAGNOSIS — Z1231 Encounter for screening mammogram for malignant neoplasm of breast: Secondary | ICD-10-CM

## 2021-12-31 DIAGNOSIS — Z0001 Encounter for general adult medical examination with abnormal findings: Secondary | ICD-10-CM

## 2022-01-25 ENCOUNTER — Encounter: Payer: 59 | Admitting: Nurse Practitioner

## 2022-02-07 ENCOUNTER — Other Ambulatory Visit (HOSPITAL_COMMUNITY): Payer: Self-pay

## 2022-02-09 ENCOUNTER — Other Ambulatory Visit (HOSPITAL_COMMUNITY): Payer: Self-pay

## 2022-02-14 ENCOUNTER — Other Ambulatory Visit (HOSPITAL_COMMUNITY): Payer: Self-pay

## 2022-02-14 MED ORDER — COVID-19 AT HOME ANTIGEN TEST VI KIT
PACK | 0 refills | Status: DC
  Filled 2022-02-14: qty 4, 30d supply, fill #0

## 2022-02-22 DIAGNOSIS — N939 Abnormal uterine and vaginal bleeding, unspecified: Secondary | ICD-10-CM | POA: Diagnosis not present

## 2022-03-29 ENCOUNTER — Other Ambulatory Visit (HOSPITAL_COMMUNITY): Payer: Self-pay

## 2022-03-29 ENCOUNTER — Other Ambulatory Visit: Payer: Self-pay | Admitting: Family Medicine

## 2022-03-29 MED ORDER — FLUTICASONE PROPIONATE 50 MCG/ACT NA SUSP
NASAL | 5 refills | Status: DC
  Filled 2022-03-29: qty 16, 30d supply, fill #0
  Filled 2022-06-18: qty 16, 30d supply, fill #1
  Filled 2022-08-03: qty 16, 30d supply, fill #2
  Filled 2022-10-09: qty 16, 30d supply, fill #3
  Filled 2022-11-15 – 2022-11-16 (×2): qty 16, 30d supply, fill #4
  Filled 2023-01-13: qty 16, 30d supply, fill #5

## 2022-04-17 ENCOUNTER — Other Ambulatory Visit (HOSPITAL_COMMUNITY): Payer: Self-pay

## 2022-05-06 ENCOUNTER — Other Ambulatory Visit (HOSPITAL_COMMUNITY): Payer: Self-pay

## 2022-05-15 ENCOUNTER — Other Ambulatory Visit (HOSPITAL_COMMUNITY): Payer: Self-pay

## 2022-06-18 ENCOUNTER — Other Ambulatory Visit (HOSPITAL_COMMUNITY): Payer: Self-pay

## 2022-06-19 ENCOUNTER — Other Ambulatory Visit (HOSPITAL_COMMUNITY): Payer: Self-pay

## 2022-06-29 ENCOUNTER — Other Ambulatory Visit (HOSPITAL_COMMUNITY): Payer: Self-pay

## 2022-08-03 ENCOUNTER — Other Ambulatory Visit (HOSPITAL_COMMUNITY): Payer: Self-pay

## 2022-11-16 ENCOUNTER — Other Ambulatory Visit (HOSPITAL_COMMUNITY): Payer: Self-pay

## 2023-01-13 ENCOUNTER — Other Ambulatory Visit: Payer: Self-pay | Admitting: Nurse Practitioner

## 2023-01-13 ENCOUNTER — Other Ambulatory Visit: Payer: Self-pay

## 2023-01-13 DIAGNOSIS — E7800 Pure hypercholesterolemia, unspecified: Secondary | ICD-10-CM

## 2023-01-15 ENCOUNTER — Other Ambulatory Visit: Payer: Self-pay

## 2023-01-15 ENCOUNTER — Telehealth: Payer: Self-pay | Admitting: Nurse Practitioner

## 2023-01-15 ENCOUNTER — Other Ambulatory Visit (HOSPITAL_COMMUNITY): Payer: Self-pay

## 2023-01-15 DIAGNOSIS — E7800 Pure hypercholesterolemia, unspecified: Secondary | ICD-10-CM

## 2023-01-15 MED ORDER — SIMVASTATIN 40 MG PO TABS
40.0000 mg | ORAL_TABLET | Freq: Every evening | ORAL | 0 refills | Status: DC
  Filled 2023-01-15: qty 30, 30d supply, fill #0

## 2023-01-15 NOTE — Telephone Encounter (Signed)
Caller Name: Mikya Call back phone #: [phone]  Reason for Call: Pt has CPE scheduled for 5/3 her Zocar will run out before and she was told it was denied. Can she get it refilled until this appt.

## 2023-01-15 NOTE — Telephone Encounter (Signed)
Sent refill to pharmacy. 

## 2023-01-18 ENCOUNTER — Other Ambulatory Visit (HOSPITAL_COMMUNITY): Payer: Self-pay

## 2023-02-16 ENCOUNTER — Other Ambulatory Visit: Payer: Self-pay | Admitting: Nurse Practitioner

## 2023-02-16 ENCOUNTER — Other Ambulatory Visit: Payer: Self-pay | Admitting: Family Medicine

## 2023-02-16 DIAGNOSIS — I10 Essential (primary) hypertension: Secondary | ICD-10-CM

## 2023-02-16 DIAGNOSIS — E7800 Pure hypercholesterolemia, unspecified: Secondary | ICD-10-CM

## 2023-02-17 ENCOUNTER — Other Ambulatory Visit (HOSPITAL_COMMUNITY): Payer: Self-pay

## 2023-02-17 MED ORDER — LOSARTAN POTASSIUM-HCTZ 50-12.5 MG PO TABS
1.0000 | ORAL_TABLET | Freq: Every day | ORAL | 3 refills | Status: DC
  Filled 2023-02-17: qty 90, 90d supply, fill #0
  Filled 2023-05-21: qty 90, 90d supply, fill #1
  Filled 2023-09-01: qty 90, 90d supply, fill #2
  Filled 2023-12-04: qty 90, 90d supply, fill #3

## 2023-02-17 MED ORDER — SIMVASTATIN 40 MG PO TABS
40.0000 mg | ORAL_TABLET | Freq: Every evening | ORAL | 0 refills | Status: DC
  Filled 2023-02-17: qty 30, 30d supply, fill #0

## 2023-02-17 MED ORDER — FLUTICASONE PROPIONATE 50 MCG/ACT NA SUSP
NASAL | 5 refills | Status: DC
  Filled 2023-02-17: qty 16, 30d supply, fill #0
  Filled 2023-05-21: qty 16, 30d supply, fill #1
  Filled 2023-08-07: qty 16, 30d supply, fill #2
  Filled 2023-10-06: qty 16, 30d supply, fill #3
  Filled 2023-11-06 – 2023-11-07 (×2): qty 16, 30d supply, fill #4
  Filled 2024-01-12: qty 16, 30d supply, fill #5

## 2023-02-18 ENCOUNTER — Other Ambulatory Visit (HOSPITAL_COMMUNITY): Payer: Self-pay

## 2023-02-20 ENCOUNTER — Other Ambulatory Visit: Payer: Self-pay | Admitting: Nurse Practitioner

## 2023-02-20 DIAGNOSIS — Z1231 Encounter for screening mammogram for malignant neoplasm of breast: Secondary | ICD-10-CM

## 2023-02-21 ENCOUNTER — Ambulatory Visit
Admission: RE | Admit: 2023-02-21 | Discharge: 2023-02-21 | Disposition: A | Discharge status: Home / Self Care | Payer: Commercial Managed Care - PPO | Source: Ambulatory Visit | Attending: Nurse Practitioner | Admitting: Nurse Practitioner

## 2023-02-21 DIAGNOSIS — Z1231 Encounter for screening mammogram for malignant neoplasm of breast: Secondary | ICD-10-CM | POA: Diagnosis not present

## 2023-03-10 ENCOUNTER — Other Ambulatory Visit (HOSPITAL_COMMUNITY)
Admission: RE | Admit: 2023-03-10 | Discharge: 2023-03-10 | Disposition: A | Discharge status: Home / Self Care | Payer: Commercial Managed Care - PPO | Source: Ambulatory Visit | Attending: Nurse Practitioner | Admitting: Nurse Practitioner

## 2023-03-10 ENCOUNTER — Encounter: Payer: Self-pay | Admitting: Nurse Practitioner

## 2023-03-10 ENCOUNTER — Other Ambulatory Visit (HOSPITAL_COMMUNITY): Payer: Self-pay

## 2023-03-10 ENCOUNTER — Ambulatory Visit (INDEPENDENT_AMBULATORY_CARE_PROVIDER_SITE_OTHER): Payer: Commercial Managed Care - PPO | Admitting: Nurse Practitioner

## 2023-03-10 VITALS — BP 130/82 | HR 64 | Temp 98.1°F | Resp 16 | Ht 62.0 in | Wt 176.8 lb

## 2023-03-10 DIAGNOSIS — Z1159 Encounter for screening for other viral diseases: Secondary | ICD-10-CM

## 2023-03-10 DIAGNOSIS — E7800 Pure hypercholesterolemia, unspecified: Secondary | ICD-10-CM

## 2023-03-10 DIAGNOSIS — I10 Essential (primary) hypertension: Secondary | ICD-10-CM | POA: Diagnosis not present

## 2023-03-10 DIAGNOSIS — N926 Irregular menstruation, unspecified: Secondary | ICD-10-CM | POA: Diagnosis not present

## 2023-03-10 DIAGNOSIS — Z0001 Encounter for general adult medical examination with abnormal findings: Secondary | ICD-10-CM | POA: Diagnosis not present

## 2023-03-10 MED ORDER — SIMVASTATIN 40 MG PO TABS
40.0000 mg | ORAL_TABLET | Freq: Every evening | ORAL | 1 refills | Status: DC
  Filled 2023-03-10 – 2023-03-26 (×3): qty 90, 90d supply, fill #0
  Filled 2023-07-03: qty 90, 90d supply, fill #1

## 2023-03-10 NOTE — Assessment & Plan Note (Signed)
BP at goal with losartan/hctz Chronic ankle edema, improves with elevation and use of compression stocking. No sign of cellulitis BP Readings from Last 3 Encounters:  03/10/23 130/82  12/21/21 124/80  08/08/21 128/76   Check CMP Refill sent

## 2023-03-10 NOTE — Patient Instructions (Addendum)
Go to lab Maintain Heart healthy diet and daily exercise. Maintain current medications.  Preventive Care 64-42 Years Old, Female Preventive care refers to lifestyle choices and visits with your health care provider that can promote health and wellness. Preventive care visits are also called wellness exams. What can I expect for my preventive care visit? Counseling Your health care provider may ask you questions about your: Medical history, including: Past medical problems. Family medical history. Pregnancy history. Current health, including: Menstrual cycle. Method of birth control. Emotional well-being. Home life and relationship well-being. Sexual activity and sexual health. Lifestyle, including: Alcohol, nicotine or tobacco, and drug use. Access to firearms. Diet, exercise, and sleep habits. Work and work Astronomer. Sunscreen use. Safety issues such as seatbelt and bike helmet use. Physical exam Your health care provider will check your: Height and weight. These may be used to calculate your BMI (body mass index). BMI is a measurement that tells if you are at a healthy weight. Waist circumference. This measures the distance around your waistline. This measurement also tells if you are at a healthy weight and may help predict your risk of certain diseases, such as type 2 diabetes and high blood pressure. Heart rate and blood pressure. Body temperature. Skin for abnormal spots. What immunizations do I need?  Vaccines are usually given at various ages, according to a schedule. Your health care provider will recommend vaccines for you based on your age, medical history, and lifestyle or other factors, such as travel or where you work. What tests do I need? Screening Your health care provider may recommend screening tests for certain conditions. This may include: Lipid and cholesterol levels. Diabetes screening. This is done by checking your blood sugar (glucose) after you  have not eaten for a while (fasting). Pelvic exam and Pap test. Hepatitis B test. Hepatitis C test. HIV (human immunodeficiency virus) test. STI (sexually transmitted infection) testing, if you are at risk. Lung cancer screening. Colorectal cancer screening. Mammogram. Talk with your health care provider about when you should start having regular mammograms. This may depend on whether you have a family history of breast cancer. BRCA-related cancer screening. This may be done if you have a family history of breast, ovarian, tubal, or peritoneal cancers. Bone density scan. This is done to screen for osteoporosis. Talk with your health care provider about your test results, treatment options, and if necessary, the need for more tests. Follow these instructions at home: Eating and drinking  Eat a diet that includes fresh fruits and vegetables, whole grains, lean protein, and low-fat dairy products. Take vitamin and mineral supplements as recommended by your health care provider. Do not drink alcohol if: Your health care provider tells you not to drink. You are pregnant, may be pregnant, or are planning to become pregnant. If you drink alcohol: Limit how much you have to 0-1 drink a day. Know how much alcohol is in your drink. In the [place] one drink equals one 12 oz bottle of beer (355 mL), one 5 oz glass of wine (148 mL), or one 1 oz glass of hard liquor (44 mL). Lifestyle Brush your teeth every morning and night with fluoride toothpaste. Floss one time each day. Exercise for at least 30 minutes 5 or more days each week. Do not use any products that contain nicotine or tobacco. These products include cigarettes, chewing tobacco, and vaping devices, such as e-cigarettes. If you need help quitting, ask your health care provider. Do not use drugs. If you are  sexually active, practice safe sex. Use a condom or other form of protection to prevent STIs. If you do not wish to become pregnant, use  a form of birth control. If you plan to become pregnant, see your health care provider for a prepregnancy visit. Take aspirin only as told by your health care provider. Make sure that you understand how much to take and what form to take. Work with your health care provider to find out whether it is safe and beneficial for you to take aspirin daily. Find healthy ways to manage stress, such as: Meditation, yoga, or listening to music. Journaling. Talking to a trusted person. Spending time with friends and family. Minimize exposure to UV radiation to reduce your risk of skin cancer. Safety Always wear your seat belt while driving or riding in a vehicle. Do not drive: If you have been drinking alcohol. Do not ride with someone who has been drinking. When you are tired or distracted. While texting. If you have been using any mind-altering substances or drugs. Wear a helmet and other protective equipment during sports activities. If you have firearms in your house, make sure you follow all gun safety procedures. Seek help if you have been physically or sexually abused. What's next? Visit your health care provider once a year for an annual wellness visit. Ask your health care provider how often you should have your eyes and teeth checked. Stay up to date on all vaccines. This information is not intended to replace advice given to you by your health care provider. Make sure you discuss any questions you have with your health care provider. Document Revised: 03/21/2021 Document Reviewed: 03/21/2021 Elsevier Patient Education  2024 [employer].

## 2023-03-10 NOTE — Assessment & Plan Note (Signed)
Repeat lipid panel. ?Maintain crestor dose ?

## 2023-03-10 NOTE — Assessment & Plan Note (Signed)
LMP: 03/04/2023, 02/02/2023, 11/29/22, 10/31/22 No menstrual cycle in March. Had premenstrual symptoms and possible vaginal spotting but she is not sure.  Check FSH, THYROID, LH and estrogen today

## 2023-03-10 NOTE — Progress Notes (Signed)
Complete physical exam  Patient: Tina Cooper   DOB: 08/24/1981   42 y.o. Female  MRN: [id number] Visit Date: 03/10/2023  Subjective:    Chief Complaint  Patient presents with   Annual Exam   Tina Cooper is a 42 y.o. female who presents today for a complete physical exam. She reports consuming a general diet.  alking  She generally feels fairly well. She reports sleeping fairly well. She does have additional problems to discuss today.  Vision:No Dental:Yes STD Screen:Yes  BP Readings from Last 3 Encounters:  03/10/23 130/82  12/21/21 124/80  08/08/21 128/76   Wt Readings from Last 3 Encounters:  03/10/23 176 lb 12.8 oz (80.2 kg)  12/21/21 176 lb 3.2 oz (79.9 kg)  08/08/21 172 lb (78 kg)   Most recent fall risk assessment:    03/10/2023    1:40 PM  Fall Risk   Falls in the past year? 0  Number falls in past yr: 0  Injury with Fall? 0  Risk for fall due to : No Fall Risks  Follow up Falls evaluation completed     Depression screen:Yes - No Depression Most recent depression screenings:    03/10/2023    1:40 PM 12/21/2021    1:14 PM  PHQ 2/9 Scores  PHQ - 2 Score 0 0    HPI  Irregular periods/menstrual cycles LMP: 03/04/2023, 02/02/2023, 11/29/22, 10/31/22 No menstrual cycle in March. Had premenstrual symptoms and possible vaginal spotting but she is not sure.  Check FSH, THYROID, LH and estrogen today  Pure hypercholesterolemia Repeat lipid panel Maintain crestor dose  Essential (primary) hypertension BP at goal with losartan/hctz Chronic ankle edema, improves with elevation and use of compression stocking. No sign of cellulitis BP Readings from Last 3 Encounters:  03/10/23 130/82  12/21/21 124/80  08/08/21 128/76   Check CMP Refill sent   Past Medical History:  Diagnosis Date   Anemia    Hyperlipidemia    Past Surgical History:  Procedure Laterality Date   WISDOM TOOTH EXTRACTION     Social History   Socioeconomic History   Marital status:  Single    Spouse name: Not on file   Number of children: Not on file   Years of education: Not on file   Highest education level: Not on file  Occupational History   Occupation: Charity fundraiser    Employer: [employer]  Tobacco Use   Smoking status: Never   Smokeless tobacco: Never  Vaping Use   Vaping Use: Never used  Substance and Sexual Activity   Alcohol use: Yes    Alcohol/week: 0.0 standard drinks of alcohol    Comment: occasional   Drug use: No   Sexual activity: Yes    Birth control/protection: None  Other Topics Concern   Not on file  Social History Narrative   Not on file   Social Determinants of Health   Financial Resource Strain: Not on file  Food Insecurity: Not on file  Transportation Needs: Not on file  Physical Activity: Not on file  Stress: Not on file  Social Connections: Not on file  Intimate Partner Violence: Not on file   Family Status  Relation Name Status   Mother  Alive   Father  Alive   Family History  Problem Relation Age of Onset   Diabetes Mother    Hypertension Mother    Hyperlipidemia Father    Hypertension Father    Not on File  Patient Care Team: [place],  Bonna Gains, NP as PCP - General (Internal Medicine)   Medications: Outpatient Medications Prior to Visit  Medication Sig   b complex vitamins capsule Take 1 capsule by mouth daily.   docusate sodium (COLACE) 100 MG capsule Take 100 mg by mouth 2 (two) times daily.   ferrous sulfate 325 (65 FE) MG tablet Take 325 mg by mouth daily with breakfast.   fluticasone (FLONASE) 50 MCG/ACT nasal spray PLACE 2 SPRAYS IN EACH NOSTRIL ONCE A DAY   losartan-hydrochlorothiazide (HYZAAR) 50-12.5 MG tablet Take 1 tablet by mouth daily.   meloxicam (MOBIC) 7.5 MG tablet Take 1 tablet (7.5 mg total) by mouth daily.   VITAMIN D PO    [DISCONTINUED] COVID-19 At Home Antigen Test (CARESTART COVID-19 HOME TEST) KIT Use as directed (Patient not taking: Reported on 03/10/2023)   [DISCONTINUED] furosemide (LASIX)  20 MG tablet Take 1/2 to 1 tablet by mouth once daily as needed.   [DISCONTINUED] simvastatin (ZOCOR) 40 MG tablet Take 1 tablet by mouth every evening.   No facility-administered medications prior to visit.    Review of Systems  Constitutional:  Negative for activity change, appetite change and unexpected weight change.  Respiratory: Negative.    Cardiovascular: Negative.   Gastrointestinal: Negative.   Endocrine: Negative for cold intolerance and heat intolerance.  Genitourinary: Negative.   Musculoskeletal: Negative.   Skin: Negative.   Neurological: Negative.   Hematological: Negative.   Psychiatric/Behavioral:  Negative for behavioral problems, decreased concentration, dysphoric mood, hallucinations, self-injury, sleep disturbance and suicidal ideas. The patient is not nervous/anxious.         Objective:  BP 130/82 (BP Location: Left Arm, Patient Position: Sitting, Cuff Size: Large)   Pulse 64   Temp 98.1 F (36.7 C) (Temporal)   Resp 16   Ht 5\' 2"  (1.575 m)   Wt 176 lb 12.8 oz (80.2 kg)   SpO2 98%   BMI 32.34 kg/m     Physical Exam Vitals and nursing note reviewed.  Constitutional:      General: She is not in acute distress. HENT:     Right Ear: Tympanic membrane, ear canal and external ear normal.     Left Ear: Tympanic membrane, ear canal and external ear normal.     Nose: Nose normal.  Eyes:     Extraocular Movements: Extraocular movements intact.     Conjunctiva/sclera: Conjunctivae normal.     Pupils: Pupils are equal, round, and reactive to light.  Neck:     Thyroid: No thyroid mass, thyromegaly or thyroid tenderness.  Cardiovascular:     Rate and Rhythm: Normal rate and regular rhythm.     Pulses: Normal pulses.     Heart sounds: Normal heart sounds.  Pulmonary:     Effort: Pulmonary effort is normal.     Breath sounds: Normal breath sounds.  Abdominal:     General: Bowel sounds are normal.     Palpations: Abdomen is soft.  Musculoskeletal:         General: Normal range of motion.     Cervical back: Normal range of motion and neck supple.     Right lower leg: No edema.     Left lower leg: No edema.  Lymphadenopathy:     Cervical: No cervical adenopathy.  Skin:    General: Skin is warm and dry.  Neurological:     Mental Status: She is alert and oriented to person, place, and time.     Cranial Nerves: No cranial nerve deficit.  Psychiatric:        Mood and Affect: Mood normal.        Behavior: Behavior normal.        Thought Content: Thought content normal.      No results found for any visits on 03/10/23.    Assessment & Plan:    Routine Health Maintenance and Physical Exam  Immunization History  Administered Date(s) Administered   Influenza Whole 07/21/2009   Influenza-Unspecified 07/07/2014, 07/07/2021   Td 08/28/2005    Health Maintenance  Topic Date Due   COVID-19 Vaccine (1) Never done   INFLUENZA VACCINE  05/08/2023   PAP SMEAR-Modifier  12/21/2024   Hepatitis C Screening  Completed   HIV Screening  Completed   HPV VACCINES  Aged Out   DTaP/Tdap/Td  Discontinued   Discussed health benefits of physical activity, and encouraged her to engage in regular exercise appropriate for her age and condition.  Problem List Items Addressed This Visit       Cardiovascular and Mediastinum   Essential (primary) hypertension    BP at goal with losartan/hctz Chronic ankle edema, improves with elevation and use of compression stocking. No sign of cellulitis BP Readings from Last 3 Encounters:  03/10/23 130/82  12/21/21 124/80  08/08/21 128/76  Check CMP Refill sent      Relevant Medications   simvastatin (ZOCOR) 40 MG tablet     Other   Irregular periods/menstrual cycles    LMP: 03/04/2023, 02/02/2023, 11/29/22, 10/31/22 No menstrual cycle in March. Had premenstrual symptoms and possible vaginal spotting but she is not sure.  Check FSH, THYROID, LH and estrogen today      Relevant Orders   TSH    Estradiol   LH   FSH   Pure hypercholesterolemia    Repeat lipid panel Maintain crestor dose      Relevant Medications   simvastatin (ZOCOR) 40 MG tablet   Other Visit Diagnoses     Encounter for preventative adult health care exam with abnormal findings    -  Primary   Relevant Orders   Comprehensive metabolic panel   Encounter for screening for viral disease       Relevant Orders   HIV Antibody (routine testing w rflx)   RPR   Urine cytology ancillary only   Hepatitis C antibody   Hep B Core Ab W/Reflex      Return in about 6 months (around 09/09/2023) for HTN, hyperlipidemia (fasting).     Alysia Penna, NP

## 2023-03-11 LAB — COMPREHENSIVE METABOLIC PANEL
ALT: 15 U/L (ref 0–35)
AST: 16 U/L (ref 0–37)
Albumin: 4.5 g/dL (ref 3.5–5.2)
Alkaline Phosphatase: 53 U/L (ref 39–117)
BUN: 9 mg/dL (ref 6–23)
CO2: 29 mEq/L (ref 19–32)
Calcium: 9.5 mg/dL (ref 8.4–10.5)
Chloride: 99 mEq/L (ref 96–112)
Creatinine, Ser: 0.83 mg/dL (ref 0.40–1.20)
GFR: 87.39 mL/min (ref >60.00)
Glucose, Bld: 84 mg/dL (ref 70–99)
Potassium: 3.3 mEq/L — ABNORMAL LOW (ref 3.5–5.1)
Sodium: 137 mEq/L (ref 135–145)
Total Bilirubin: 0.5 mg/dL (ref 0.2–1.2)
Total Protein: 7.2 g/dL (ref 6.0–8.3)

## 2023-03-11 LAB — HEPATITIS B CORE AB W/REFLEX: Hep B Core Total Ab: NEGATIVE

## 2023-03-11 LAB — HEPATITIS C ANTIBODY: Hepatitis C Ab: NONREACTIVE

## 2023-03-11 LAB — LIPID PANEL
Cholesterol: 214 mg/dL — ABNORMAL HIGH (ref 0–200)
HDL: 47.1 mg/dL (ref >39.00)
LDL Cholesterol: 128 mg/dL — ABNORMAL HIGH (ref 0–99)
NonHDL: 166.42
Total CHOL/HDL Ratio: 5
Triglycerides: 193 mg/dL — ABNORMAL HIGH (ref 0.0–149.0)
VLDL: 38.6 mg/dL (ref 0.0–40.0)

## 2023-03-11 LAB — RPR: RPR Ser Ql: NONREACTIVE

## 2023-03-11 LAB — LUTEINIZING HORMONE: LH: 10.21 m[IU]/mL

## 2023-03-11 LAB — ESTRADIOL: Estradiol: 103 pg/mL

## 2023-03-11 LAB — HIV ANTIBODY (ROUTINE TESTING W REFLEX): HIV 1&2 Ab, 4th Generation: NONREACTIVE

## 2023-03-11 LAB — FOLLICLE STIMULATING HORMONE: FSH: 14.2 m[IU]/mL

## 2023-03-11 LAB — TSH: TSH: 0.76 u[IU]/mL (ref 0.35–5.50)

## 2023-03-12 ENCOUNTER — Other Ambulatory Visit: Payer: Self-pay | Admitting: Nurse Practitioner

## 2023-03-12 ENCOUNTER — Other Ambulatory Visit (HOSPITAL_COMMUNITY): Payer: Self-pay

## 2023-03-12 DIAGNOSIS — E876 Hypokalemia: Secondary | ICD-10-CM

## 2023-03-12 LAB — URINE CYTOLOGY ANCILLARY ONLY
Chlamydia: NEGATIVE
Comment: NEGATIVE
Comment: NEGATIVE
Comment: NORMAL
Neisseria Gonorrhea: NEGATIVE
Trichomonas: NEGATIVE

## 2023-03-12 MED ORDER — POTASSIUM CHLORIDE CRYS ER 20 MEQ PO TBCR
20.0000 meq | EXTENDED_RELEASE_TABLET | Freq: Two times a day (BID) | ORAL | 0 refills | Status: DC
Start: 2023-03-12 — End: 2024-05-28
  Filled 2023-03-12: qty 6, 3d supply, fill #0

## 2023-03-13 ENCOUNTER — Other Ambulatory Visit: Payer: Self-pay

## 2023-03-13 ENCOUNTER — Other Ambulatory Visit (HOSPITAL_COMMUNITY): Payer: Self-pay

## 2023-03-21 ENCOUNTER — Other Ambulatory Visit (HOSPITAL_COMMUNITY): Payer: Self-pay

## 2023-03-26 ENCOUNTER — Other Ambulatory Visit (HOSPITAL_COMMUNITY): Payer: Self-pay

## 2023-05-01 DIAGNOSIS — E785 Hyperlipidemia, unspecified: Secondary | ICD-10-CM | POA: Insufficient documentation

## 2023-05-21 ENCOUNTER — Encounter (INDEPENDENT_AMBULATORY_CARE_PROVIDER_SITE_OTHER): Payer: Commercial Managed Care - PPO | Admitting: Nurse Practitioner

## 2023-05-21 ENCOUNTER — Other Ambulatory Visit: Payer: Self-pay

## 2023-05-21 ENCOUNTER — Other Ambulatory Visit: Payer: Self-pay | Admitting: Nurse Practitioner

## 2023-05-21 ENCOUNTER — Other Ambulatory Visit (HOSPITAL_COMMUNITY): Payer: Self-pay

## 2023-05-21 DIAGNOSIS — E876 Hypokalemia: Secondary | ICD-10-CM

## 2023-05-21 DIAGNOSIS — T502X5A Adverse effect of carbonic-anhydrase inhibitors, benzothiadiazides and other diuretics, initial encounter: Secondary | ICD-10-CM | POA: Diagnosis not present

## 2023-05-21 DIAGNOSIS — G8929 Other chronic pain: Secondary | ICD-10-CM

## 2023-05-21 DIAGNOSIS — I10 Essential (primary) hypertension: Secondary | ICD-10-CM

## 2023-05-21 MED ORDER — MELOXICAM 7.5 MG PO TABS
7.5000 mg | ORAL_TABLET | Freq: Every day | ORAL | 0 refills | Status: AC
  Filled 2023-05-21: qty 90, 90d supply, fill #0

## 2023-05-22 NOTE — Telephone Encounter (Signed)
Please see the MyChart message reply(ies) for my assessment and plan.  The patient gave consent for this Medical Advice Message and is aware that it may result in a bill to their insurance company as well as the possibility that this may result in a co-payment or deductible. They are an established patient, but are not seeking medical advice exclusively about a problem treated during an in person or video visit in the last 7 days. I did not recommend an in person or video visit within 7 days of my reply.  I spent a total of 15 minutes cumulative time within 7 days through [place], NP

## 2023-05-22 NOTE — Assessment & Plan Note (Signed)
Use of furosemide in past due to chronic LE edema despite use of compression stocking. Last rx sent by previous pcp. Med discontinued due to hypokalemia and current use of hydrochlorothiazide. Repeat BMP If persistent hypokalemia, do not refill furosemide. Consider the following med modification: d/c losartan/hydrochlorothiazide. Start amlodipine and spironolactone.

## 2023-05-27 ENCOUNTER — Other Ambulatory Visit (HOSPITAL_COMMUNITY): Payer: Self-pay

## 2023-07-04 ENCOUNTER — Other Ambulatory Visit (HOSPITAL_COMMUNITY): Payer: Self-pay

## 2023-08-12 ENCOUNTER — Other Ambulatory Visit (HOSPITAL_COMMUNITY): Payer: Self-pay

## 2023-09-06 ENCOUNTER — Other Ambulatory Visit (HOSPITAL_COMMUNITY): Payer: Self-pay

## 2023-09-30 IMAGING — MG MM DIGITAL SCREENING BILAT W/ TOMO AND CAD
8 series · 9 of 24 positions shown · non-contrast
Comparison: None.

CLINICAL DATA: Screening.

EXAM:
DIGITAL SCREENING BILATERAL MAMMOGRAM WITH TOMOSYNTHESIS AND CAD
TECHNIQUE: Bilateral screening digital craniocaudal and mediolateral oblique
mammograms were obtained. Bilateral screening digital breast
tomosynthesis was performed. The images were evaluated with
computer-aided detection.

[R MLO synth-2D]
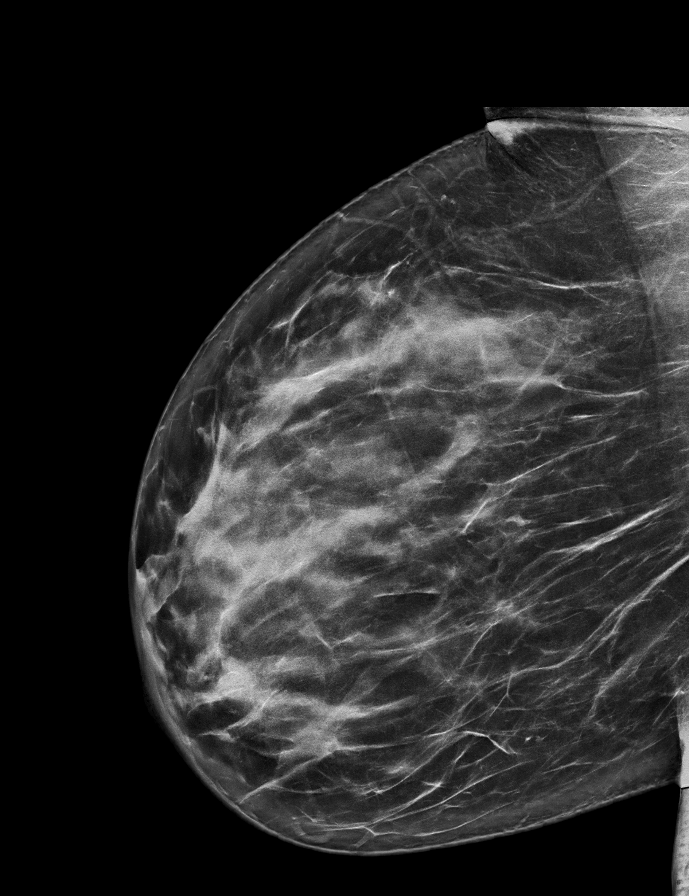

[R CC synth-2D]
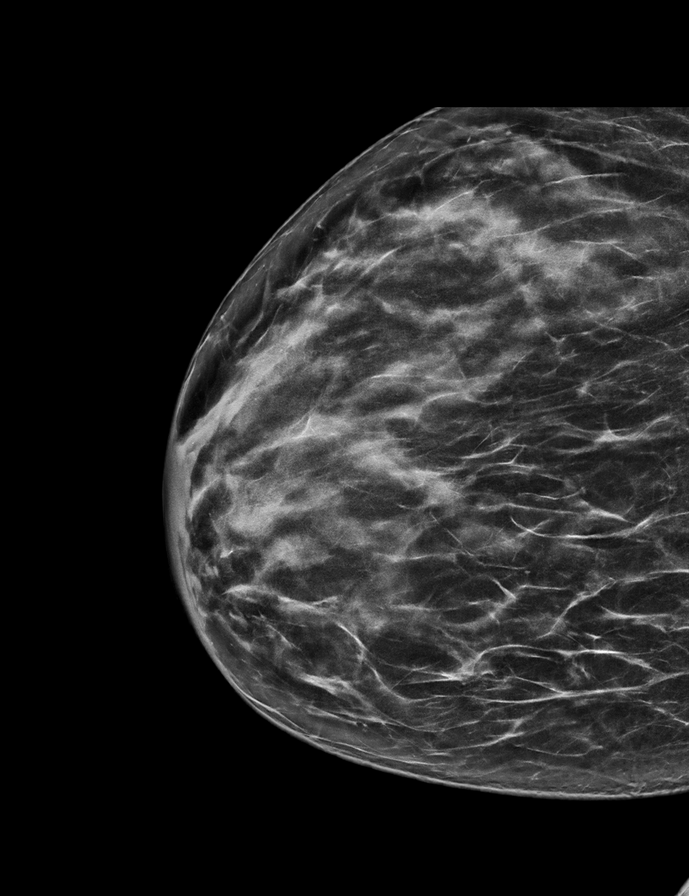

[L MLO synth-2D]
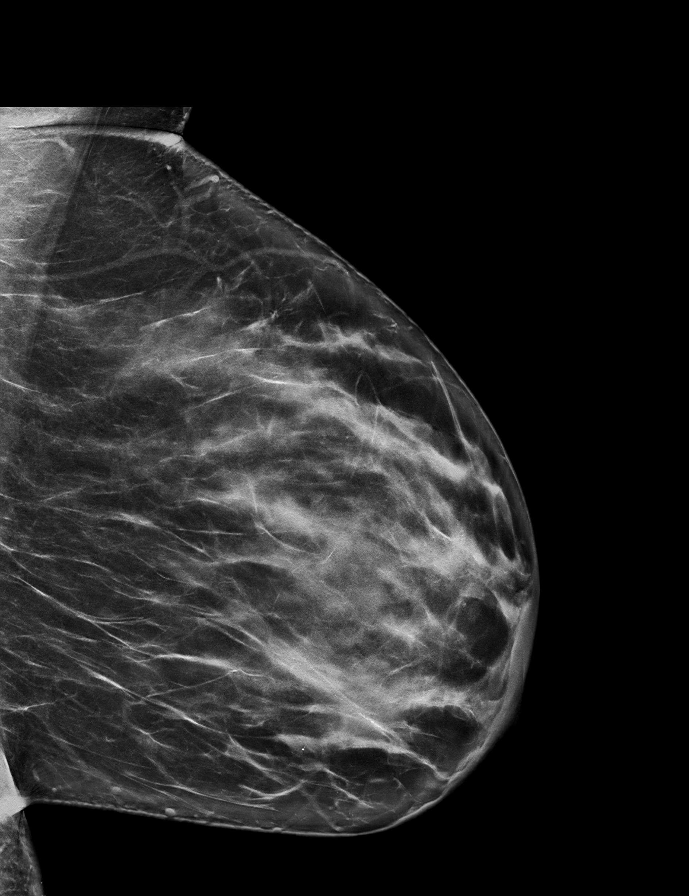

[L CC synth-2D]
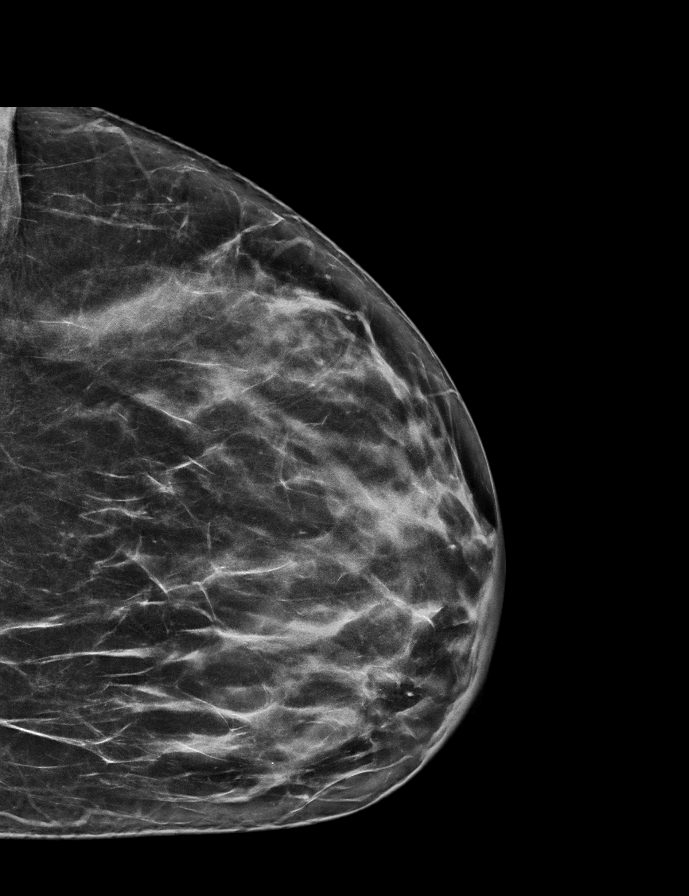

[L CC tomo · 2 of 67 frames shown]
[frame 22/67]
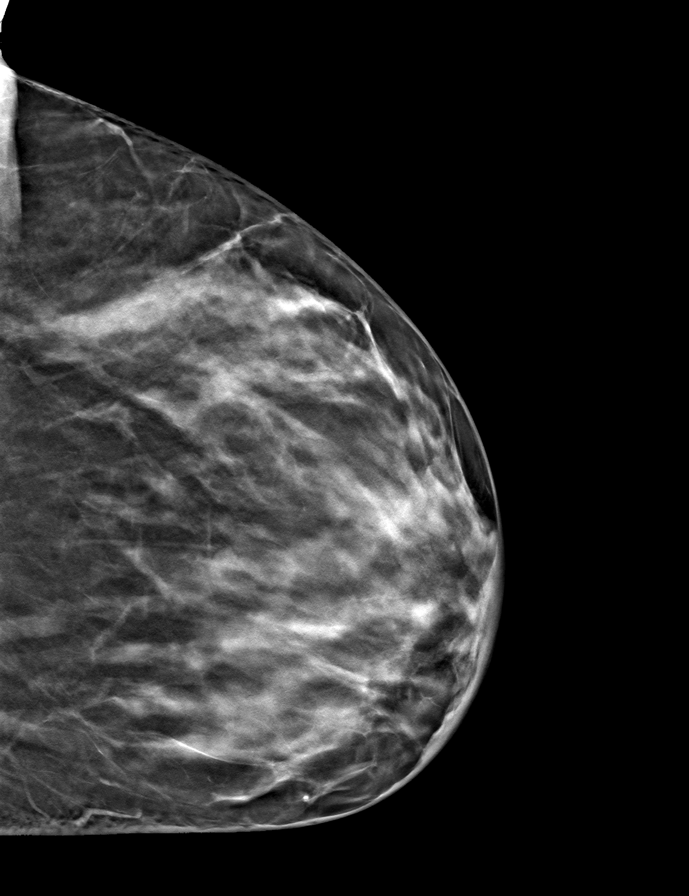
[frame 34/67]
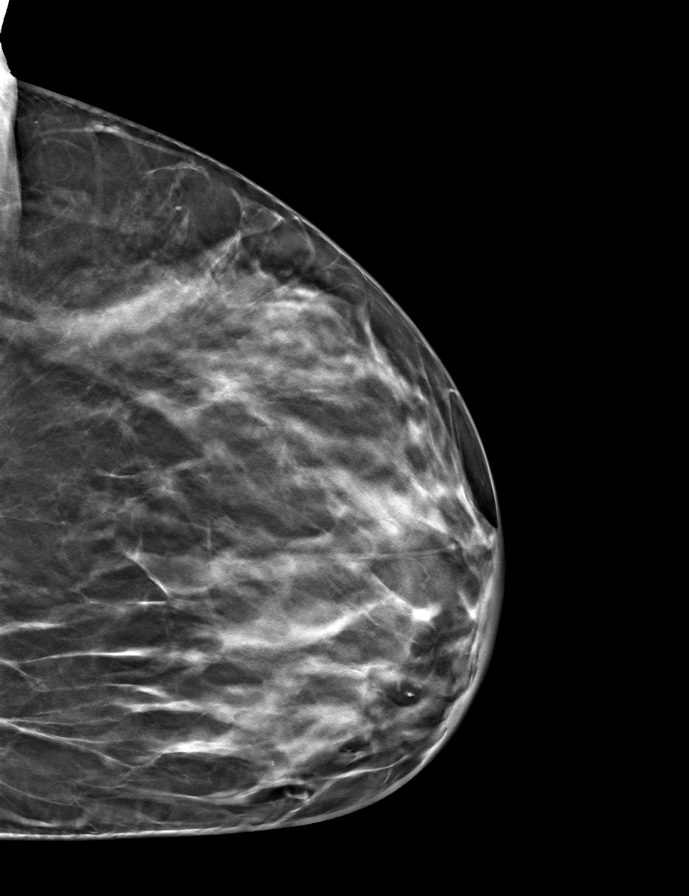

[L MLO tomo · tomo slice 39/76.0]
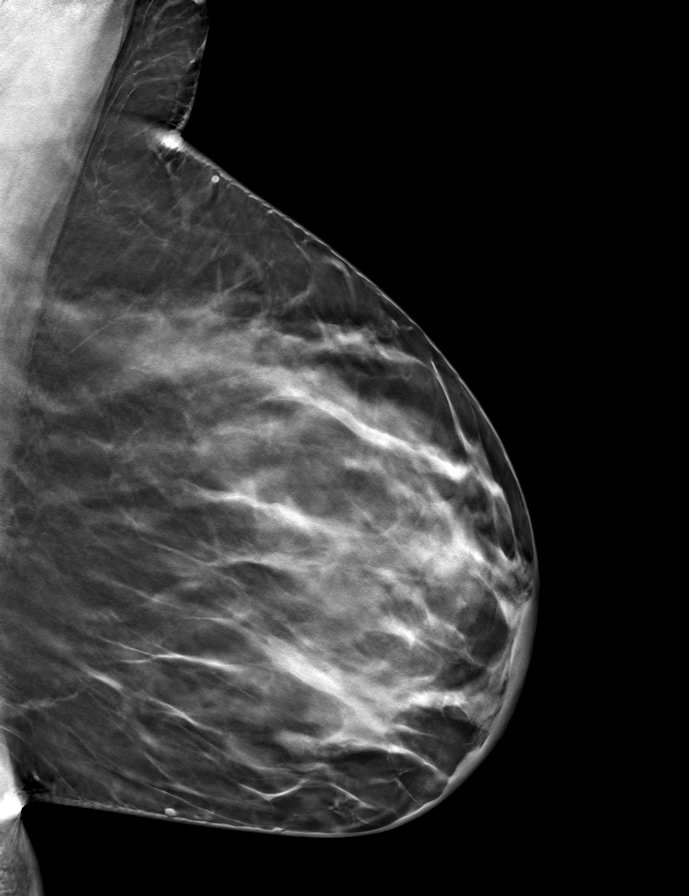

[R MLO tomo · tomo slice 37/74.0]
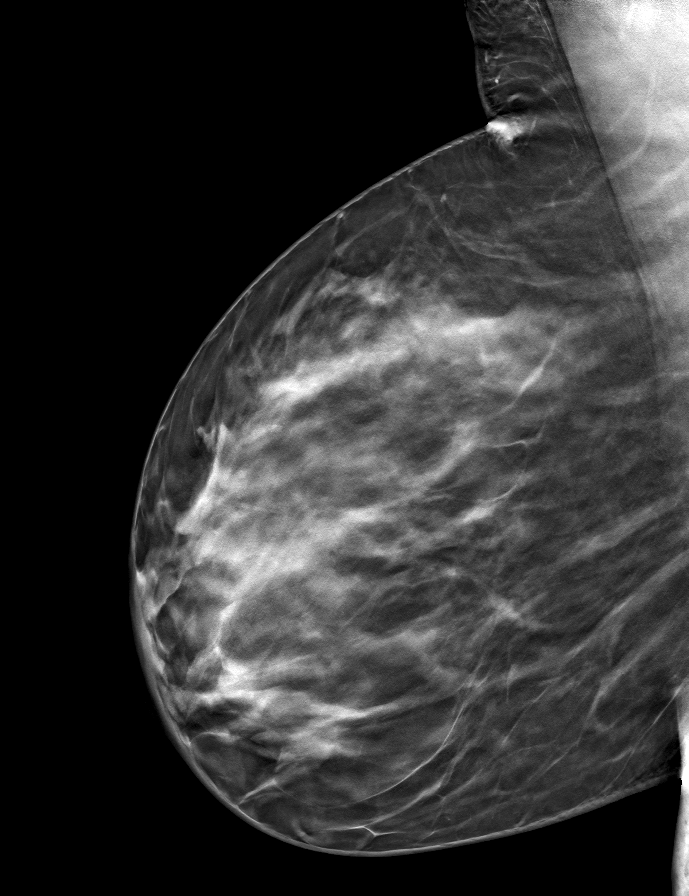

[R CC tomo · tomo slice 33/66.0]
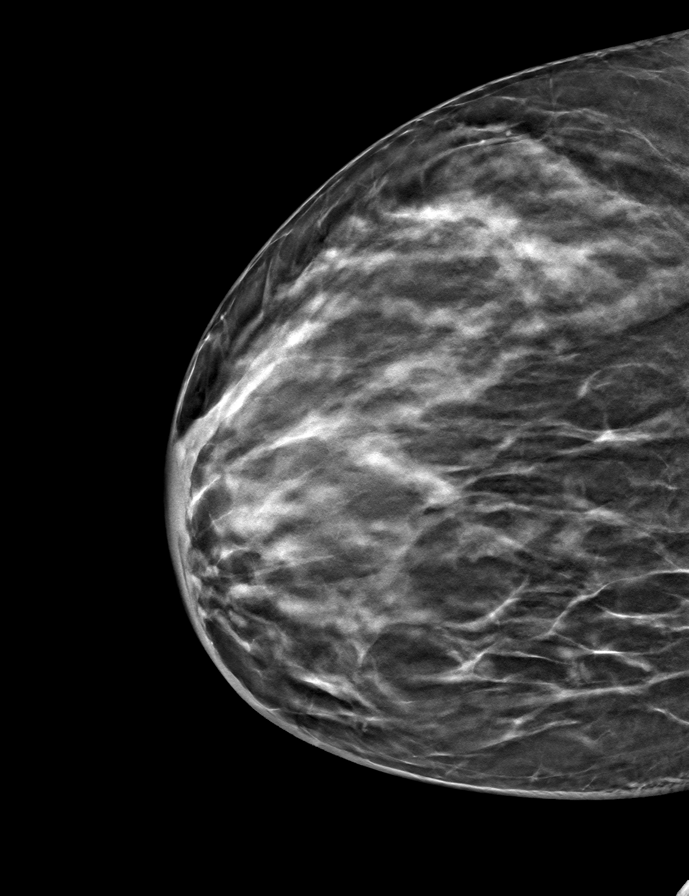

[9 of 24 positions shown; findings below may reference images not displayed]

ACR Breast Density Category c: The breast tissue is heterogeneously
dense, which may obscure small masses
FINDINGS: There are no findings suspicious for malignancy.
IMPRESSION: No mammographic evidence of malignancy. A result letter of this
screening mammogram will be mailed directly to the patient.

RECOMMENDATION:
Screening mammogram in one year. (Code:C8-T-HNK)

BI-RADS CATEGORY  1: Negative.

## 2023-10-03 ENCOUNTER — Other Ambulatory Visit: Payer: Self-pay | Admitting: Nurse Practitioner

## 2023-10-03 DIAGNOSIS — E7800 Pure hypercholesterolemia, unspecified: Secondary | ICD-10-CM

## 2023-10-06 ENCOUNTER — Other Ambulatory Visit: Payer: Self-pay | Admitting: Nurse Practitioner

## 2023-10-06 DIAGNOSIS — E7800 Pure hypercholesterolemia, unspecified: Secondary | ICD-10-CM

## 2023-10-07 ENCOUNTER — Other Ambulatory Visit (HOSPITAL_BASED_OUTPATIENT_CLINIC_OR_DEPARTMENT_OTHER): Payer: Self-pay

## 2023-10-07 ENCOUNTER — Other Ambulatory Visit (HOSPITAL_COMMUNITY): Payer: Self-pay

## 2023-10-07 ENCOUNTER — Other Ambulatory Visit: Payer: Self-pay

## 2023-10-07 MED ORDER — SIMVASTATIN 40 MG PO TABS
40.0000 mg | ORAL_TABLET | Freq: Every evening | ORAL | 0 refills | Status: DC
  Filled 2023-10-07: qty 90, 90d supply, fill #0

## 2023-10-10 ENCOUNTER — Other Ambulatory Visit (HOSPITAL_COMMUNITY): Payer: Self-pay

## 2023-11-07 ENCOUNTER — Other Ambulatory Visit (HOSPITAL_COMMUNITY): Payer: Self-pay

## 2024-01-09 ENCOUNTER — Other Ambulatory Visit: Payer: Self-pay | Admitting: Nurse Practitioner

## 2024-01-09 DIAGNOSIS — E7800 Pure hypercholesterolemia, unspecified: Secondary | ICD-10-CM

## 2024-01-12 NOTE — Telephone Encounter (Signed)
 Medication: Simvastatin (Zoccor) 40 mg   Directions: Take 1 tablet by mouth every evening  Last given: 10/07/23 Number refills: 0 Last o/v: 03/10/23 Follow up: 6 month f/u (around 09/09/23), 05/19/24 (Transfer of Care) Labs: 03/10/23

## 2024-01-13 ENCOUNTER — Other Ambulatory Visit (HOSPITAL_COMMUNITY): Payer: Self-pay

## 2024-01-13 MED ORDER — SIMVASTATIN 40 MG PO TABS
40.0000 mg | ORAL_TABLET | Freq: Every evening | ORAL | 0 refills | Status: DC
  Filled 2024-01-13: qty 90, 90d supply, fill #0

## 2024-01-17 ENCOUNTER — Other Ambulatory Visit (HOSPITAL_COMMUNITY): Payer: Self-pay

## 2024-03-05 ENCOUNTER — Other Ambulatory Visit: Payer: Self-pay | Admitting: Nurse Practitioner

## 2024-03-05 ENCOUNTER — Other Ambulatory Visit (HOSPITAL_COMMUNITY): Payer: Self-pay

## 2024-03-05 DIAGNOSIS — I10 Essential (primary) hypertension: Secondary | ICD-10-CM

## 2024-03-05 MED ORDER — FLUTICASONE PROPIONATE 50 MCG/ACT NA SUSP
NASAL | 0 refills | Status: DC
  Filled 2024-03-05: qty 16, 30d supply, fill #0

## 2024-03-05 MED ORDER — LOSARTAN POTASSIUM-HCTZ 50-12.5 MG PO TABS
1.0000 | ORAL_TABLET | Freq: Every day | ORAL | 0 refills | Status: DC
  Filled 2024-03-05: qty 90, 90d supply, fill #0

## 2024-03-05 NOTE — Telephone Encounter (Signed)
 Medication: Simvastatin (Zoccor) 40 mg   Directions: Take 1 tablet by mouth every evening  Last given: 10/07/23 Number refills: 0 Last o/v: 03/10/23 Follow up: 6 month f/u (around 09/09/23), 05/19/24 (Transfer of Care) Labs: 03/10/23

## 2024-03-12 DIAGNOSIS — Z124 Encounter for screening for malignant neoplasm of cervix: Secondary | ICD-10-CM | POA: Diagnosis not present

## 2024-03-12 DIAGNOSIS — Z1151 Encounter for screening for human papillomavirus (HPV): Secondary | ICD-10-CM | POA: Diagnosis not present

## 2024-03-12 DIAGNOSIS — Z113 Encounter for screening for infections with a predominantly sexual mode of transmission: Secondary | ICD-10-CM | POA: Diagnosis not present

## 2024-03-26 ENCOUNTER — Other Ambulatory Visit: Payer: Self-pay | Admitting: Nurse Practitioner

## 2024-03-26 DIAGNOSIS — Z1231 Encounter for screening mammogram for malignant neoplasm of breast: Secondary | ICD-10-CM

## 2024-04-06 ENCOUNTER — Ambulatory Visit
Admission: RE | Admit: 2024-04-06 | Discharge: 2024-04-06 | Disposition: A | Discharge status: Home / Self Care | Source: Ambulatory Visit | Attending: Nurse Practitioner

## 2024-04-06 DIAGNOSIS — Z1231 Encounter for screening mammogram for malignant neoplasm of breast: Secondary | ICD-10-CM

## 2024-04-14 ENCOUNTER — Other Ambulatory Visit: Payer: Self-pay | Admitting: Nurse Practitioner

## 2024-04-14 DIAGNOSIS — E7800 Pure hypercholesterolemia, unspecified: Secondary | ICD-10-CM

## 2024-04-16 ENCOUNTER — Other Ambulatory Visit: Payer: Self-pay | Admitting: Nurse Practitioner

## 2024-04-16 DIAGNOSIS — I10 Essential (primary) hypertension: Secondary | ICD-10-CM

## 2024-04-16 NOTE — Telephone Encounter (Addendum)
 Medication: Losartan -hydrochlorothiazide  50-12.5 Directions: Take 1 tablet by mouth daily Last given: 03/05/24 Number refills: 0 Last o/v: 03/10/23 Follow up: 6 months  Labs: 03/10/23

## 2024-04-16 NOTE — Telephone Encounter (Signed)
 Tried calling patient and could not leave a voice message. Patient has an appointment with Rondall Buttner, PA 05/18/24

## 2024-04-16 NOTE — Telephone Encounter (Signed)
 Medication: Simvastatin  (Zocor ) 40 mg  Directions: Take 1 tablet by mouth daily Last given: 01/13/24 Number refills: 0 Last o/v: 03/10/23 Follow up: 6 months  Labs: 03/10/23

## 2024-04-16 NOTE — Telephone Encounter (Signed)
 Called patient and could not leave a voice message. Patient is overdue for an office visit. Refill sent to patient's pharmacy and will sent patient a MyChart message to give the office a call for a follow up appointment

## 2024-04-16 NOTE — Telephone Encounter (Signed)
 Medication: Flonase  50 mcg Directions: Place 2 sprays in each nostril once a day  Last given: 03/05/24 Number refills: 0 Last o/v: 03/10/23 Follow up: 6 months  Labs: 03/10/23

## 2024-04-19 ENCOUNTER — Other Ambulatory Visit (HOSPITAL_COMMUNITY): Payer: Self-pay

## 2024-04-19 ENCOUNTER — Other Ambulatory Visit: Payer: Self-pay

## 2024-04-19 MED ORDER — LOSARTAN POTASSIUM-HCTZ 50-12.5 MG PO TABS
1.0000 | ORAL_TABLET | Freq: Every day | ORAL | 0 refills | Status: DC
  Filled 2024-04-19: qty 30, 30d supply, fill #0

## 2024-04-19 MED ORDER — FLUTICASONE PROPIONATE 50 MCG/ACT NA SUSP
NASAL | 0 refills | Status: DC
  Filled 2024-04-19: qty 16, 30d supply, fill #0

## 2024-04-19 MED ORDER — SIMVASTATIN 40 MG PO TABS
40.0000 mg | ORAL_TABLET | Freq: Every evening | ORAL | 0 refills | Status: DC
  Filled 2024-04-19 – 2024-04-20 (×3): qty 30, 30d supply, fill #0

## 2024-04-20 ENCOUNTER — Other Ambulatory Visit (HOSPITAL_COMMUNITY): Payer: Self-pay

## 2024-05-18 ENCOUNTER — Telehealth: Payer: Self-pay | Admitting: Nurse Practitioner

## 2024-05-19 ENCOUNTER — Ambulatory Visit: Admitting: Physician Assistant

## 2024-05-19 ENCOUNTER — Encounter: Payer: Self-pay | Admitting: Physician Assistant

## 2024-05-19 ENCOUNTER — Other Ambulatory Visit (HOSPITAL_COMMUNITY): Payer: Self-pay

## 2024-05-19 VITALS — BP 138/88 | HR 73 | Temp 97.7°F | Ht 62.0 in | Wt 183.0 lb

## 2024-05-19 DIAGNOSIS — E7800 Pure hypercholesterolemia, unspecified: Secondary | ICD-10-CM | POA: Diagnosis not present

## 2024-05-19 DIAGNOSIS — D500 Iron deficiency anemia secondary to blood loss (chronic): Secondary | ICD-10-CM

## 2024-05-19 DIAGNOSIS — I10 Essential (primary) hypertension: Secondary | ICD-10-CM

## 2024-05-19 DIAGNOSIS — E669 Obesity, unspecified: Secondary | ICD-10-CM | POA: Diagnosis not present

## 2024-05-19 DIAGNOSIS — Z0000 Encounter for general adult medical examination without abnormal findings: Secondary | ICD-10-CM | POA: Diagnosis not present

## 2024-05-19 LAB — LIPID PANEL
Cholesterol: 203 mg/dL — ABNORMAL HIGH (ref 0–200)
HDL: 46.2 mg/dL (ref >39.00)
LDL Cholesterol: 138 mg/dL — ABNORMAL HIGH (ref 0–99)
NonHDL: 157.02
Total CHOL/HDL Ratio: 4
Triglycerides: 95 mg/dL (ref 0.0–149.0)
VLDL: 19 mg/dL (ref 0.0–40.0)

## 2024-05-19 LAB — COMPREHENSIVE METABOLIC PANEL WITH GFR
ALT: 11 U/L (ref 0–35)
AST: 15 U/L (ref 0–37)
Albumin: 4.5 g/dL (ref 3.5–5.2)
Alkaline Phosphatase: 46 U/L (ref 39–117)
BUN: 10 mg/dL (ref 6–23)
CO2: 29 meq/L (ref 19–32)
Calcium: 9.5 mg/dL (ref 8.4–10.5)
Chloride: 102 meq/L (ref 96–112)
Creatinine, Ser: 0.88 mg/dL (ref 0.40–1.20)
GFR: 80.79 mL/min (ref >60.00)
Glucose, Bld: 93 mg/dL (ref 70–99)
Potassium: 3.5 meq/L (ref 3.5–5.1)
Sodium: 137 meq/L (ref 135–145)
Total Bilirubin: 0.4 mg/dL (ref 0.2–1.2)
Total Protein: 7.4 g/dL (ref 6.0–8.3)

## 2024-05-19 LAB — IBC + FERRITIN
Ferritin: 23.5 ng/mL (ref 10.0–291.0)
Iron: 58 ug/dL (ref 42–145)
Saturation Ratios: 15.6 % — ABNORMAL LOW (ref 20.0–50.0)
TIBC: 372.4 ug/dL (ref 250.0–450.0)
Transferrin: 266 mg/dL (ref 212.0–360.0)

## 2024-05-19 LAB — CBC WITH DIFFERENTIAL/PLATELET
Basophils Absolute: 0.1 K/uL (ref 0.0–0.1)
Basophils Relative: 0.7 % (ref 0.0–3.0)
Eosinophils Absolute: 0.1 K/uL (ref 0.0–0.7)
Eosinophils Relative: 1.3 % (ref 0.0–5.0)
HCT: 39.3 % (ref 36.0–46.0)
Hemoglobin: 13 g/dL (ref 12.0–15.0)
Lymphocytes Relative: 30.9 % (ref 12.0–46.0)
Lymphs Abs: 2.7 K/uL (ref 0.7–4.0)
MCHC: 33 g/dL (ref 30.0–36.0)
MCV: 83.4 fl (ref 78.0–100.0)
Monocytes Absolute: 0.7 K/uL (ref 0.1–1.0)
Monocytes Relative: 7.4 % (ref 3.0–12.0)
Neutro Abs: 5.2 K/uL (ref 1.4–7.7)
Neutrophils Relative %: 59.7 % (ref 43.0–77.0)
Platelets: 321 K/uL (ref 150.0–400.0)
RBC: 4.72 Mil/uL (ref 3.87–5.11)
RDW: 13.9 % (ref 11.5–15.5)
WBC: 8.8 K/uL (ref 4.0–10.5)

## 2024-05-19 MED ORDER — FUROSEMIDE 20 MG PO TABS
20.0000 mg | ORAL_TABLET | Freq: Every day | ORAL | 0 refills | Status: AC | PRN
  Filled 2024-05-19 (×2): qty 30, 30d supply, fill #0

## 2024-05-19 MED ORDER — LOSARTAN POTASSIUM-HCTZ 100-25 MG PO TABS
1.0000 | ORAL_TABLET | Freq: Every day | ORAL | 3 refills | Status: AC
  Filled 2024-05-19 (×2): qty 90, 90d supply, fill #0
  Filled 2024-08-21: qty 90, 90d supply, fill #1

## 2024-05-19 NOTE — Patient Instructions (Addendum)
 It was great to see you!  A coronary CT calcium scan is a computed tomography scan of the heart for the assessment of severity of coronary artery disease. Specifically, it looks for calcium deposits in atherosclerotic plaques in the coronary arteries that can narrow arteries and increase the risk of heart attack. This is $99 with [facility] currently. Let me know your thoughts.  Increase medication to losartan  100 mg daily and hydrochloroTHIAZIDE  to 25 mg daily Continue plan for as needed lasix  -- we may add a potassium supplement to take with this medication if needed  Please go to the lab for blood work.   Our office will call you with your results unless you have chosen to receive results via MyChart.  If your blood work is normal we will follow-up each year for physicals and as scheduled for chronic medical problems.  If anything is abnormal we will treat accordingly and get you in for a follow-up.  Take care,  Hatim Homann

## 2024-05-19 NOTE — Progress Notes (Signed)
 Subjective:    Tina Cooper is a 43 y.o. female and is here for a comprehensive physical exam.  HPI  There are no preventive care reminders to display for this patient.  Discussed the use of AI scribe software for clinical note transcription with the patient, who gave verbal consent to proceed.  History of Present Illness Tina Cooper is a 43 year old female with hypertension who presents for a routine physical exam and medication review.  She takes losartan  hydrochlorothiazide  50/12.5 mg once daily in the evening for hypertension. Home blood pressure readings are elevated, with recent measurements of 143/98, 151/104, 150/106, 150/94, and 144/95. She experiences leg and foot swelling, not relieved by current medication. Lasix  was previously effective for swelling but discontinued due to hypokalemia.  She uses meloxicam  as needed for piriformis syndrome and ibuprofen occasionally, neither more often then 1-2 times per week. She consumes one cup of coffee at work, uses salt occasionally, and denies alcohol and energy drink consumption. She reports no significant stress. There is a family history of hypertension.  She is on simvastatin  for hyperlipidemia, with LDL reduced from 177 to 128. Family history includes high cholesterol but no cardiovascular events.  She takes an iron supplement for iron deficiency anemia, initially presenting with dyspnea and pica. Menstrual bleeding has improved with organic tampons. She uses a stool softener as needed due to constipation from iron supplementation.  Family history includes a mother with breast cancer, diabetes, hypertension, and dementia, and a father with high cholesterol and hypertension. Her sister has MS, and her brother has type 2 diabetes. Her maternal grandmother was diabetic. She works night shifts as a Engineer, civil (consulting) and has increased physical activity by walking her niece's dog. She also cares for her nearby parents.   Health  Maintenance: Immunizations -- utd Colonoscopy -- n/a Mammogram -- utd PAP -- UpToDate -- Dr Duwaine Blumenthal Bone Density -- n/a Diet -- does not currently eat balanced/healthy diet Exercise -- limited; trying to get into walking more regularly  Sleep habits -- intermittent issues Mood -- no concerns  UTD with dentist? - yes UTD with eye doctor? - yes  Weight history: Wt Readings from Last 10 Encounters:  05/19/24 183 lb (83 kg)  03/10/23 176 lb 12.8 oz (80.2 kg)  12/21/21 176 lb 3.2 oz (79.9 kg)  08/08/21 172 lb (78 kg)  04/27/21 171 lb (77.6 kg)  04/21/20 172 lb 12.8 oz (78.4 kg)  02/10/20 170 lb (77.1 kg)  04/21/19 164 lb 3.2 oz (74.5 kg)  02/22/19 170 lb (77.1 kg)  06/29/18 169 lb 6.4 oz (76.8 kg)   Body mass index is 33.47 kg/m. No LMP recorded.  Alcohol use:  reports current alcohol use.  Tobacco use:  Tobacco Use: Low Risk  (04/06/2024)   Patient History    Smoking Tobacco Use: Never    Smokeless Tobacco Use: Never    Passive Exposure: Not on file   Eligible for lung cancer screening? no     05/19/2024   10:52 AM  Depression screen PHQ 2/9  Decreased Interest 0  Down, Depressed, Hopeless 0  PHQ - 2 Score 0  Altered sleeping 1  Tired, decreased energy 0  Change in appetite 0  Feeling bad or failure about yourself  0  Trouble concentrating 0  Moving slowly or fidgety/restless 0  Suicidal thoughts 0  PHQ-9 Score 1  Difficult doing work/chores Not difficult at all     Other providers/specialists: Patient Care Team: Job Lukes,  PA as PCP - General (Physician Assistant)    PMHx, SurgHx, SocialHx, Medications, and Allergies were reviewed in the Visit Navigator and updated as appropriate.   Past Medical History:  Diagnosis Date   Anemia    Hyperlipidemia      Past Surgical History:  Procedure Laterality Date   WISDOM TOOTH EXTRACTION       Family History  Problem Relation Age of Onset   Breast cancer Mother 56   Diabetes Mother     Hypertension Mother    Dementia Mother    Hyperlipidemia Father    Hypertension Father    Multiple sclerosis Sister    Diabetes Mellitus II Brother    Diabetes Maternal Grandmother     Social History   Tobacco Use   Smoking status: Never   Smokeless tobacco: Never  Vaping Use   Vaping status: Never Used  Substance Use Topics   Alcohol use: Yes    Alcohol/week: 0.0 standard drinks of alcohol    Comment: occasional   Drug use: No    Review of Systems:   Review of Systems  Constitutional:  Negative for chills, fever, malaise/fatigue and weight loss.  HENT:  Negative for hearing loss, sinus pain and sore throat.   Respiratory:  Negative for cough and hemoptysis.   Cardiovascular:  Positive for leg swelling. Negative for chest pain, palpitations and PND.  Gastrointestinal:  Negative for abdominal pain, constipation, diarrhea, heartburn, nausea and vomiting.  Genitourinary:  Negative for dysuria, frequency and urgency.  Musculoskeletal:  Negative for back pain, myalgias and neck pain.  Skin:  Negative for itching and rash.  Neurological:  Negative for dizziness, tingling, seizures and headaches.  Endo/Heme/Allergies:  Negative for polydipsia.  Psychiatric/Behavioral:  Negative for depression. The patient is not nervous/anxious.     Objective:   BP 138/88   Pulse 73   Temp 97.7 F (36.5 C)   Ht 5' 2 (1.575 m)   Wt 183 lb (83 kg)   SpO2 98%   BMI 33.47 kg/m  Body mass index is 33.47 kg/m.   General Appearance:    Alert, cooperative, no distress, appears stated age  Head:    Normocephalic, without obvious abnormality, atraumatic  Eyes:    PERRL, conjunctiva/corneas clear, EOM's intact, fundi    benign, both eyes  Ears:    Normal TM's and external ear canals, both ears  Nose:   Nares normal, septum midline, mucosa normal, no drainage    or sinus tenderness  Throat:   Lips, mucosa, and tongue normal; teeth and gums normal  Neck:   Supple, symmetrical, trachea  midline, no adenopathy;    thyroid :  no enlargement/tenderness/nodules  Back:     Symmetric, no curvature, ROM normal, no CVA tenderness  Lungs:     Clear to auscultation bilaterally, respirations unlabored  Chest Wall:    No tenderness or deformity   Heart:    Regular rate and rhythm, S1 and S2 normal, no murmur, rub or gallop  Breast Exam:    Deferred  Abdomen:     Soft, non-tender, bowel sounds active all four quadrants,    no masses, no organomegaly  Genitalia:    Deferred  Extremities:   Extremities normal, atraumatic, no cyanosis Bilateral lower extremity with swelling right>left   Pulses:   2+ and symmetric all extremities  Skin:   Skin color, texture, turgor normal, no rashes or lesions  Lymph nodes:   Cervical, supraclavicular, and axillary nodes normal  Neurologic:  CNII-XII intact, normal strength, sensation and reflexes    throughout    Assessment/Plan:   Assessment and Plan Assessment & Plan Adult Wellness Visit Comprehensive adult wellness visit conducted. Discussed current medications, family history, and lifestyle factors. Reviewed cancer screenings and preventive care measures. - Conduct physical examination. - Review and update family history. - Discuss lifestyle modifications and preventive care. - Provide handouts with meal plan ideas.  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 Blood pressure readings at home consistently elevated. Chronic swelling in legs and feet not improved with current medication. Previous use of PRN Lasix  was effective for edema but was discontinued due to low potassium levels. Family history of hypertension present. Discussed increasing the dose of losartan  hydrochlorothiazide  and potentially adding PRN Lasix  for edema management. - Order blood work to check potassium, kidney function, and electrolytes. - Increase losartan  hydrochlorothiazide  to  100/25 mg daily. - Prescribe PRN Lasix  for edema management, especially for upcoming travel. - Advise on lifestyle modifications including reducing salt intake and increasing physical activity. - Provide information on potential need for potassium supplementation.  Hyperlipidemia Hyperlipidemia managed with simvastatin . LDL previously elevated, currently improved. No side effects reported. Discussed potential for calcium score to assess cardiovascular risk, but noted she is already on cholesterol medication. Plan to evaluate current cholesterol levels with lab work before making further decisions. - Order lab work to assess current cholesterol levels. - Consider holding simvastatin  and obtaining a calcium score if labs indicate. - Provide information on calcium score testing.  Iron deficiency anemia, history of, monitoring History of iron deficiency anemia with previous symptoms. Currently on oral iron supplementation but not taken consistently due to constipation. Periods have become lighter, reducing previous heavy bleeding. Discussed potential switch to slow-release iron supplement. - Order lab work to assess current iron levels, including ferritin. - Discuss potential switch to slow-release iron supplement to reduce constipation. - Evaluate need for further investigation if iron deficiency persists despite lighter periods.        [name], PA-C [facility]

## 2024-05-20 NOTE — Telephone Encounter (Signed)
 Error

## 2024-05-21 ENCOUNTER — Ambulatory Visit: Payer: Self-pay | Admitting: Physician Assistant

## 2024-05-21 DIAGNOSIS — E7800 Pure hypercholesterolemia, unspecified: Secondary | ICD-10-CM

## 2024-05-28 ENCOUNTER — Other Ambulatory Visit (HOSPITAL_COMMUNITY): Payer: Self-pay

## 2024-05-28 ENCOUNTER — Other Ambulatory Visit: Payer: Self-pay | Admitting: Physician Assistant

## 2024-05-28 DIAGNOSIS — E876 Hypokalemia: Secondary | ICD-10-CM

## 2024-05-28 DIAGNOSIS — E7800 Pure hypercholesterolemia, unspecified: Secondary | ICD-10-CM

## 2024-05-28 MED ORDER — POTASSIUM CHLORIDE CRYS ER 20 MEQ PO TBCR
EXTENDED_RELEASE_TABLET | ORAL | 0 refills | Status: AC
  Filled 2024-05-28: qty 30, 30d supply, fill #0

## 2024-05-28 NOTE — Telephone Encounter (Signed)
 Please advise if pt should stay off statin prior to having CT Calcium score test done and okay to order test?

## 2024-05-31 ENCOUNTER — Other Ambulatory Visit (HOSPITAL_COMMUNITY): Payer: Self-pay

## 2024-05-31 ENCOUNTER — Other Ambulatory Visit: Payer: Self-pay | Admitting: Physician Assistant

## 2024-05-31 MED ORDER — FLUTICASONE PROPIONATE 50 MCG/ACT NA SUSP
NASAL | 0 refills | Status: DC
  Filled 2024-05-31: qty 16, 30d supply, fill #0

## 2024-06-29 ENCOUNTER — Ambulatory Visit (HOSPITAL_BASED_OUTPATIENT_CLINIC_OR_DEPARTMENT_OTHER)
Admission: RE | Admit: 2024-06-29 | Discharge: 2024-06-29 | Disposition: A | Discharge status: Home / Self Care | Payer: Self-pay | Source: Ambulatory Visit | Attending: Physician Assistant | Admitting: Physician Assistant

## 2024-06-29 ENCOUNTER — Other Ambulatory Visit: Payer: Self-pay | Admitting: Physician Assistant

## 2024-06-29 DIAGNOSIS — E7800 Pure hypercholesterolemia, unspecified: Secondary | ICD-10-CM | POA: Insufficient documentation

## 2024-06-30 ENCOUNTER — Other Ambulatory Visit (HOSPITAL_COMMUNITY): Payer: Self-pay

## 2024-06-30 ENCOUNTER — Other Ambulatory Visit: Payer: Self-pay

## 2024-06-30 ENCOUNTER — Ambulatory Visit: Payer: Self-pay | Admitting: Physician Assistant

## 2024-06-30 MED ORDER — FLUTICASONE PROPIONATE 50 MCG/ACT NA SUSP
NASAL | 0 refills | Status: DC
  Filled 2024-06-30: qty 16, 30d supply, fill #0

## 2024-06-30 NOTE — Telephone Encounter (Signed)
 See MyChart message

## 2024-07-26 ENCOUNTER — Other Ambulatory Visit: Payer: Self-pay | Admitting: Physician Assistant

## 2024-07-26 ENCOUNTER — Other Ambulatory Visit: Payer: Self-pay

## 2024-07-26 ENCOUNTER — Other Ambulatory Visit (HOSPITAL_COMMUNITY): Payer: Self-pay

## 2024-07-26 MED ORDER — FLUTICASONE PROPIONATE 50 MCG/ACT NA SUSP
NASAL | 0 refills | Status: DC
  Filled 2024-07-26: qty 16, 30d supply, fill #0

## 2024-08-21 ENCOUNTER — Encounter: Payer: Self-pay | Admitting: Physician Assistant

## 2024-09-07 ENCOUNTER — Telehealth: Admitting: Physician Assistant

## 2024-09-07 ENCOUNTER — Encounter: Payer: Self-pay | Admitting: Physician Assistant

## 2024-09-07 VITALS — Ht 62.0 in | Wt 190.2 lb

## 2024-09-07 DIAGNOSIS — E669 Obesity, unspecified: Secondary | ICD-10-CM | POA: Diagnosis not present

## 2024-09-07 NOTE — Progress Notes (Signed)
 Virtual Visit via Video Note   I, Tina Cooper, connected with  Tina Cooper  ([id number], 08-18-1981) on 09/07/24 at 11:20 AM EST by a video-enabled telemedicine application and verified that I am speaking with the correct person using two identifiers.  Location: Patient: Home Provider: [facility] office   I discussed the limitations of evaluation and management by telemedicine and the availability of in person appointments. The patient expressed understanding and agreed to proceed.    History of Present Illness: Tina Cooper is a 43 y.o. who identifies as a female who was assigned female at birth, and is being seen today for weight loss.  She is exercising and eating healthy and wants to start a GLP-1. Her current insurance does not cover these medications. She is not planning for pregnancy, reports there is no family history of thyroid  cancer.     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   Iron deficiency anemia due to chronic blood loss 12/22/2015   Pure hypercholesterolemia 08/18/2009    Allergies: No Known Allergies Medications:  Current Outpatient Medications:    b complex vitamins capsule, Take 1 capsule by mouth daily., Disp: 30 capsule, Rfl: 3   docusate sodium (COLACE) 100 MG capsule, Take 100 mg by mouth 2 (two) times daily., Disp: , Rfl:    ferrous sulfate  325 (65 FE) MG tablet, Take 325 mg by mouth daily with breakfast., Disp: , Rfl:    fluticasone  (FLONASE ) 50 MCG/ACT nasal spray, PLACE 2 SPRAYS IN EACH NOSTRIL ONCE A DAY, Disp: 16 g, Rfl: 0   furosemide  (LASIX ) 20 MG tablet, Take 1 tablet (20 mg total) by mouth daily as needed for edema.,  Disp: 30 tablet, Rfl: 0   losartan -hydrochlorothiazide  (HYZAAR ) 100-25 MG tablet, Take 1 tablet by mouth daily., Disp: 90 tablet, Rfl: 3   meloxicam  (MOBIC ) 7.5 MG tablet, Take 1 tablet (7.5 mg total) by mouth daily., Disp: 90 tablet, Rfl: 0   potassium chloride  SA (KLOR-CON  M) 20 MEQ tablet, Take 1 tablet daily as needed when taking lasix ., Disp: 30 tablet, Rfl: 0   VITAMIN D  PO, , Disp: , Rfl:   Observations/Objective: Patient is well-developed, well-nourished in no acute distress.  Resting comfortably  at home.  Head is normocephalic, atraumatic.  No labored breathing.  Speech is clear and coherent with logical content.  Patient is alert and oriented at baseline.   Assessment and Plan: 1. Obesity, unspecified class, unspecified obesity type, unspecified whether serious comorbidity present (Primary) Reviewed medication options Suspect she is a great candidate for GLP-1 -- she is interested in [place] -- will provide sample and if tolerated, we will start this for her, self pay Follow up in 3 weeks via MyChart about medication trial Reviewed risks and benefits/side effect(s) of medication   Follow Up Instructions: I discussed the assessment and treatment plan with the patient. The patient was provided an opportunity to ask questions and all were answered. The patient agreed with the plan and demonstrated an understanding of the instructions.  A copy of instructions were sent to the patient via MyChart unless otherwise noted below.   The patient was advised to call back or seek an in-person evaluation if the  symptoms worsen or if the condition fails to improve as anticipated.  Tina Cooper, [place]

## 2024-10-18 ENCOUNTER — Encounter: Payer: Self-pay | Admitting: Physician Assistant

## 2024-10-18 DIAGNOSIS — D229 Melanocytic nevi, unspecified: Secondary | ICD-10-CM

## 2024-10-19 NOTE — Telephone Encounter (Signed)
 Lucie, is patient staying on injections for Wegovy  or did you discuss the pill?

## 2024-10-20 ENCOUNTER — Other Ambulatory Visit: Payer: Self-pay

## 2024-10-20 ENCOUNTER — Other Ambulatory Visit: Payer: Self-pay | Admitting: Physician Assistant

## 2024-10-20 MED ORDER — WEGOVY 0.25 MG/0.5ML ~~LOC~~ SOAJ: 0.2500 mg | SUBCUTANEOUS | 0 refills | Status: AC

## 2024-10-20 MED ORDER — FLUTICASONE PROPIONATE 50 MCG/ACT NA SUSP
2.0000 | Freq: Every day | NASAL | 0 refills | Status: AC
  Filled 2024-10-20: qty 16, 30d supply, fill #0

## 2024-10-20 NOTE — Addendum Note (Signed)
 Addended by: THURMON ARLAND PARAS on: 10/20/2024 08:25 AM   Modules accepted: Orders

## 2025-06-29 ENCOUNTER — Ambulatory Visit: Admitting: Physician Assistant
